# Patient Record
Sex: Female | Born: 1940 | Race: Black or African American | Hispanic: No | Smoking: Never smoker
Health system: Southern US, Community
[De-identification: ages and names within clinical notes are randomized; demographics above are authoritative.]

## PROBLEM LIST (undated history)

## (undated) DIAGNOSIS — M1990 Unspecified osteoarthritis, unspecified site: Secondary | ICD-10-CM

## (undated) DIAGNOSIS — E119 Type 2 diabetes mellitus without complications: Secondary | ICD-10-CM

## (undated) DIAGNOSIS — J309 Allergic rhinitis, unspecified: Secondary | ICD-10-CM

## (undated) DIAGNOSIS — K219 Gastro-esophageal reflux disease without esophagitis: Secondary | ICD-10-CM

## (undated) DIAGNOSIS — N3281 Overactive bladder: Secondary | ICD-10-CM

## (undated) DIAGNOSIS — I10 Essential (primary) hypertension: Secondary | ICD-10-CM

## (undated) DIAGNOSIS — G4733 Obstructive sleep apnea (adult) (pediatric): Secondary | ICD-10-CM

## (undated) DIAGNOSIS — R413 Other amnesia: Secondary | ICD-10-CM

## (undated) HISTORY — DX: Unspecified osteoarthritis, unspecified site: M19.90

## (undated) HISTORY — DX: Gastro-esophageal reflux disease without esophagitis: K21.9

## (undated) HISTORY — DX: Overactive bladder: N32.81

## (undated) HISTORY — DX: Other amnesia: R41.3

## (undated) HISTORY — DX: Allergic rhinitis, unspecified: J30.9

## (undated) HISTORY — PX: EYE SURGERY: SHX253

## (undated) HISTORY — DX: Obstructive sleep apnea (adult) (pediatric): G47.33

## (undated) HISTORY — PX: APPENDECTOMY: SHX54

---

## 2018-07-17 ENCOUNTER — Emergency Department (HOSPITAL_COMMUNITY): Payer: Medicare Other

## 2018-07-17 ENCOUNTER — Other Ambulatory Visit: Payer: Self-pay

## 2018-07-17 ENCOUNTER — Inpatient Hospital Stay (HOSPITAL_COMMUNITY)
Admission: EM | Admit: 2018-07-17 | Discharge: 2018-07-29 | DRG: 417 | Disposition: A | Discharge status: Home Health | Payer: Medicare Other | Attending: Internal Medicine | Admitting: Internal Medicine

## 2018-07-17 ENCOUNTER — Inpatient Hospital Stay (HOSPITAL_COMMUNITY): Payer: Medicare Other

## 2018-07-17 ENCOUNTER — Encounter (HOSPITAL_COMMUNITY): Payer: Self-pay | Admitting: Emergency Medicine

## 2018-07-17 DIAGNOSIS — Z833 Family history of diabetes mellitus: Secondary | ICD-10-CM

## 2018-07-17 DIAGNOSIS — E669 Obesity, unspecified: Secondary | ICD-10-CM | POA: Diagnosis not present

## 2018-07-17 DIAGNOSIS — K7460 Unspecified cirrhosis of liver: Secondary | ICD-10-CM

## 2018-07-17 DIAGNOSIS — G4700 Insomnia, unspecified: Secondary | ICD-10-CM

## 2018-07-17 DIAGNOSIS — Z880 Allergy status to penicillin: Secondary | ICD-10-CM

## 2018-07-17 DIAGNOSIS — N179 Acute kidney failure, unspecified: Secondary | ICD-10-CM | POA: Diagnosis present

## 2018-07-17 DIAGNOSIS — K668 Other specified disorders of peritoneum: Secondary | ICD-10-CM | POA: Diagnosis not present

## 2018-07-17 DIAGNOSIS — K82A1 Gangrene of gallbladder in cholecystitis: Secondary | ICD-10-CM | POA: Diagnosis present

## 2018-07-17 DIAGNOSIS — K851 Biliary acute pancreatitis without necrosis or infection: Secondary | ICD-10-CM | POA: Diagnosis present

## 2018-07-17 DIAGNOSIS — G4733 Obstructive sleep apnea (adult) (pediatric): Secondary | ICD-10-CM | POA: Diagnosis present

## 2018-07-17 DIAGNOSIS — E871 Hypo-osmolality and hyponatremia: Secondary | ICD-10-CM | POA: Diagnosis present

## 2018-07-17 DIAGNOSIS — D72829 Elevated white blood cell count, unspecified: Secondary | ICD-10-CM

## 2018-07-17 DIAGNOSIS — I10 Essential (primary) hypertension: Secondary | ICD-10-CM | POA: Diagnosis present

## 2018-07-17 DIAGNOSIS — R51 Headache: Secondary | ICD-10-CM | POA: Diagnosis not present

## 2018-07-17 DIAGNOSIS — K760 Fatty (change of) liver, not elsewhere classified: Secondary | ICD-10-CM | POA: Diagnosis present

## 2018-07-17 DIAGNOSIS — E785 Hyperlipidemia, unspecified: Secondary | ICD-10-CM | POA: Diagnosis present

## 2018-07-17 DIAGNOSIS — Z6832 Body mass index (BMI) 32.0-32.9, adult: Secondary | ICD-10-CM

## 2018-07-17 DIAGNOSIS — K819 Cholecystitis, unspecified: Secondary | ICD-10-CM | POA: Diagnosis not present

## 2018-07-17 DIAGNOSIS — Z7722 Contact with and (suspected) exposure to environmental tobacco smoke (acute) (chronic): Secondary | ICD-10-CM | POA: Diagnosis present

## 2018-07-17 DIAGNOSIS — K8000 Calculus of gallbladder with acute cholecystitis without obstruction: Secondary | ICD-10-CM | POA: Diagnosis present

## 2018-07-17 DIAGNOSIS — T8189XA Other complications of procedures, not elsewhere classified, initial encounter: Secondary | ICD-10-CM

## 2018-07-17 DIAGNOSIS — K829 Disease of gallbladder, unspecified: Secondary | ICD-10-CM

## 2018-07-17 DIAGNOSIS — F329 Major depressive disorder, single episode, unspecified: Secondary | ICD-10-CM | POA: Diagnosis present

## 2018-07-17 DIAGNOSIS — J440 Chronic obstructive pulmonary disease with acute lower respiratory infection: Secondary | ICD-10-CM | POA: Diagnosis not present

## 2018-07-17 DIAGNOSIS — E878 Other disorders of electrolyte and fluid balance, not elsewhere classified: Secondary | ICD-10-CM | POA: Diagnosis present

## 2018-07-17 DIAGNOSIS — E860 Dehydration: Secondary | ICD-10-CM | POA: Diagnosis present

## 2018-07-17 DIAGNOSIS — X088XXA Exposure to other specified smoke, fire and flames, initial encounter: Secondary | ICD-10-CM | POA: Diagnosis present

## 2018-07-17 DIAGNOSIS — K8043 Calculus of bile duct with acute cholecystitis with obstruction: Secondary | ICD-10-CM | POA: Diagnosis present

## 2018-07-17 DIAGNOSIS — Z419 Encounter for procedure for purposes other than remedying health state, unspecified: Secondary | ICD-10-CM

## 2018-07-17 DIAGNOSIS — R062 Wheezing: Secondary | ICD-10-CM

## 2018-07-17 DIAGNOSIS — T8189XS Other complications of procedures, not elsewhere classified, sequela: Secondary | ICD-10-CM | POA: Diagnosis not present

## 2018-07-17 DIAGNOSIS — Z8249 Family history of ischemic heart disease and other diseases of the circulatory system: Secondary | ICD-10-CM

## 2018-07-17 DIAGNOSIS — E119 Type 2 diabetes mellitus without complications: Secondary | ICD-10-CM | POA: Diagnosis not present

## 2018-07-17 DIAGNOSIS — K922 Gastrointestinal hemorrhage, unspecified: Secondary | ICD-10-CM | POA: Diagnosis present

## 2018-07-17 DIAGNOSIS — F0390 Unspecified dementia without behavioral disturbance: Secondary | ICD-10-CM | POA: Diagnosis present

## 2018-07-17 DIAGNOSIS — R9431 Abnormal electrocardiogram [ECG] [EKG]: Secondary | ICD-10-CM | POA: Diagnosis present

## 2018-07-17 DIAGNOSIS — R945 Abnormal results of liver function studies: Secondary | ICD-10-CM | POA: Diagnosis not present

## 2018-07-17 DIAGNOSIS — I3139 Other pericardial effusion (noninflammatory): Secondary | ICD-10-CM | POA: Diagnosis present

## 2018-07-17 DIAGNOSIS — R7989 Other specified abnormal findings of blood chemistry: Secondary | ICD-10-CM

## 2018-07-17 DIAGNOSIS — Z803 Family history of malignant neoplasm of breast: Secondary | ICD-10-CM

## 2018-07-17 DIAGNOSIS — D62 Acute posthemorrhagic anemia: Secondary | ICD-10-CM | POA: Diagnosis not present

## 2018-07-17 DIAGNOSIS — F419 Anxiety disorder, unspecified: Secondary | ICD-10-CM | POA: Diagnosis not present

## 2018-07-17 DIAGNOSIS — J209 Acute bronchitis, unspecified: Secondary | ICD-10-CM | POA: Diagnosis not present

## 2018-07-17 DIAGNOSIS — Z888 Allergy status to other drugs, medicaments and biological substances status: Secondary | ICD-10-CM

## 2018-07-17 DIAGNOSIS — E876 Hypokalemia: Secondary | ICD-10-CM | POA: Diagnosis not present

## 2018-07-17 DIAGNOSIS — Z9989 Dependence on other enabling machines and devices: Secondary | ICD-10-CM | POA: Diagnosis not present

## 2018-07-17 DIAGNOSIS — I313 Pericardial effusion (noninflammatory): Secondary | ICD-10-CM | POA: Diagnosis present

## 2018-07-17 DIAGNOSIS — Z79899 Other long term (current) drug therapy: Secondary | ICD-10-CM

## 2018-07-17 DIAGNOSIS — F03A0 Unspecified dementia, mild, without behavioral disturbance, psychotic disturbance, mood disturbance, and anxiety: Secondary | ICD-10-CM

## 2018-07-17 DIAGNOSIS — K8063 Calculus of gallbladder and bile duct with acute cholecystitis with obstruction: Secondary | ICD-10-CM | POA: Diagnosis present

## 2018-07-17 DIAGNOSIS — J9811 Atelectasis: Secondary | ICD-10-CM | POA: Diagnosis not present

## 2018-07-17 DIAGNOSIS — K219 Gastro-esophageal reflux disease without esophagitis: Secondary | ICD-10-CM | POA: Diagnosis present

## 2018-07-17 DIAGNOSIS — R188 Other ascites: Secondary | ICD-10-CM

## 2018-07-17 DIAGNOSIS — R0602 Shortness of breath: Secondary | ICD-10-CM | POA: Diagnosis not present

## 2018-07-17 DIAGNOSIS — K08109 Complete loss of teeth, unspecified cause, unspecified class: Secondary | ICD-10-CM | POA: Diagnosis present

## 2018-07-17 DIAGNOSIS — R935 Abnormal findings on diagnostic imaging of other abdominal regions, including retroperitoneum: Secondary | ICD-10-CM | POA: Diagnosis not present

## 2018-07-17 DIAGNOSIS — K769 Liver disease, unspecified: Secondary | ICD-10-CM | POA: Diagnosis not present

## 2018-07-17 HISTORY — DX: Type 2 diabetes mellitus without complications: E11.9

## 2018-07-17 HISTORY — DX: Essential (primary) hypertension: I10

## 2018-07-17 LAB — CBC
HCT: 46.7 % — ABNORMAL HIGH (ref 36.0–46.0)
Hemoglobin: 14.9 g/dL (ref 12.0–15.0)
MCH: 26.9 pg (ref 26.0–34.0)
MCHC: 31.9 g/dL (ref 30.0–36.0)
MCV: 84.4 fL (ref 80.0–100.0)
Platelets: 420 10*3/uL — ABNORMAL HIGH (ref 150–400)
RBC: 5.53 MIL/uL — ABNORMAL HIGH (ref 3.87–5.11)
RDW: 14.6 % (ref 11.5–15.5)
WBC: 19.5 10*3/uL — ABNORMAL HIGH (ref 4.0–10.5)
nRBC: 0 % (ref 0.0–0.2)

## 2018-07-17 LAB — BASIC METABOLIC PANEL
Anion gap: 14 (ref 5–15)
BUN: 18 mg/dL (ref 8–23)
CO2: 20 mmol/L — ABNORMAL LOW (ref 22–32)
Calcium: 8.6 mg/dL — ABNORMAL LOW (ref 8.9–10.3)
Chloride: 87 mmol/L — ABNORMAL LOW (ref 98–111)
Creatinine, Ser: 1.19 mg/dL — ABNORMAL HIGH (ref 0.44–1.00)
GFR calc Af Amer: 52 mL/min — ABNORMAL LOW (ref >60)
GFR calc non Af Amer: 45 mL/min — ABNORMAL LOW (ref >60)
Glucose, Bld: 121 mg/dL — ABNORMAL HIGH (ref 70–99)
Potassium: 4.9 mmol/L (ref 3.5–5.1)
Sodium: 121 mmol/L — ABNORMAL LOW (ref 135–145)

## 2018-07-17 LAB — AMMONIA: Ammonia: 37 umol/L — ABNORMAL HIGH (ref 9–35)

## 2018-07-17 LAB — COMPREHENSIVE METABOLIC PANEL
ALT: 308 U/L — ABNORMAL HIGH (ref 0–44)
AST: 350 U/L — ABNORMAL HIGH (ref 15–41)
Albumin: 3.2 g/dL — ABNORMAL LOW (ref 3.5–5.0)
Alkaline Phosphatase: 372 U/L — ABNORMAL HIGH (ref 38–126)
Anion gap: 16 — ABNORMAL HIGH (ref 5–15)
BUN: 12 mg/dL (ref 8–23)
CO2: 18 mmol/L — ABNORMAL LOW (ref 22–32)
Calcium: 9 mg/dL (ref 8.9–10.3)
Chloride: 91 mmol/L — ABNORMAL LOW (ref 98–111)
Creatinine, Ser: 1.06 mg/dL — ABNORMAL HIGH (ref 0.44–1.00)
GFR calc Af Amer: 59 mL/min — ABNORMAL LOW (ref >60)
GFR calc non Af Amer: 51 mL/min — ABNORMAL LOW (ref >60)
Glucose, Bld: 182 mg/dL — ABNORMAL HIGH (ref 70–99)
Potassium: 3.8 mmol/L (ref 3.5–5.1)
Sodium: 125 mmol/L — ABNORMAL LOW (ref 135–145)
Total Bilirubin: 8.1 mg/dL — ABNORMAL HIGH (ref 0.3–1.2)
Total Protein: 6.6 g/dL (ref 6.5–8.1)

## 2018-07-17 LAB — URINALYSIS, ROUTINE W REFLEX MICROSCOPIC
Bacteria, UA: NONE SEEN
Bilirubin Urine: NEGATIVE
Glucose, UA: NEGATIVE mg/dL
Hgb urine dipstick: NEGATIVE
Ketones, ur: NEGATIVE mg/dL
Nitrite: NEGATIVE
Protein, ur: NEGATIVE mg/dL
Specific Gravity, Urine: 1 — ABNORMAL LOW (ref 1.005–1.030)
pH: 6 (ref 5.0–8.0)

## 2018-07-17 LAB — TROPONIN I: Troponin I: 0.03 ng/mL (ref <0.03)

## 2018-07-17 LAB — PROTIME-INR
INR: 1.25
Prothrombin Time: 15.6 seconds — ABNORMAL HIGH (ref 11.4–15.2)

## 2018-07-17 LAB — ACETAMINOPHEN LEVEL: Acetaminophen (Tylenol), Serum: <10 ug/mL — ABNORMAL LOW (ref 10–30)

## 2018-07-17 LAB — PHOSPHORUS: Phosphorus: 3.8 mg/dL (ref 2.5–4.6)

## 2018-07-17 LAB — MAGNESIUM: Magnesium: 1.7 mg/dL (ref 1.7–2.4)

## 2018-07-17 LAB — PROCALCITONIN: Procalcitonin: 13.99 ng/mL

## 2018-07-17 LAB — LIPASE, BLOOD: Lipase: 144 U/L — ABNORMAL HIGH (ref 11–51)

## 2018-07-17 LAB — ETHANOL: Alcohol, Ethyl (B): <10 mg/dL (ref <10)

## 2018-07-17 MED ORDER — PIPERACILLIN-TAZOBACTAM 3.375 G IVPB 30 MIN
3.3750 g | Freq: Once | INTRAVENOUS | Status: AC
  Administered 2018-07-17: 3.375 g via INTRAVENOUS
  Filled 2018-07-17: qty 50

## 2018-07-17 MED ORDER — SODIUM CHLORIDE (PF) 0.9 % IJ SOLN
INTRAMUSCULAR | Status: AC
  Filled 2018-07-17: qty 50

## 2018-07-17 MED ORDER — HYDROMORPHONE HCL 1 MG/ML IJ SOLN
0.5000 mg | Freq: Once | INTRAMUSCULAR | Status: AC
  Administered 2018-07-17: 0.5 mg via INTRAVENOUS
  Filled 2018-07-17: qty 1

## 2018-07-17 MED ORDER — CIPROFLOXACIN IN D5W 400 MG/200ML IV SOLN
400.0000 mg | Freq: Once | INTRAVENOUS | Status: AC
  Administered 2018-07-17: 400 mg via INTRAVENOUS
  Filled 2018-07-17: qty 200

## 2018-07-17 MED ORDER — ONDANSETRON HCL 4 MG/2ML IJ SOLN
4.0000 mg | Freq: Once | INTRAMUSCULAR | Status: AC
  Administered 2018-07-17: 4 mg via INTRAVENOUS
  Filled 2018-07-17: qty 2

## 2018-07-17 MED ORDER — IOPAMIDOL (ISOVUE-300) INJECTION 61%
INTRAVENOUS | Status: AC
  Filled 2018-07-17: qty 100

## 2018-07-17 MED ORDER — IOPAMIDOL (ISOVUE-300) INJECTION 61%
100.0000 mL | Freq: Once | INTRAVENOUS | Status: AC | PRN
  Administered 2018-07-17: 100 mL via INTRAVENOUS

## 2018-07-17 MED ORDER — SODIUM CHLORIDE 0.9% FLUSH
3.0000 mL | Freq: Once | INTRAVENOUS | Status: AC
  Administered 2018-07-17: 3 mL via INTRAVENOUS

## 2018-07-17 MED ORDER — PANTOPRAZOLE SODIUM 40 MG IV SOLR
40.0000 mg | Freq: Two times a day (BID) | INTRAVENOUS | Status: DC
  Administered 2018-07-17 – 2018-07-22 (×10): 40 mg via INTRAVENOUS
  Filled 2018-07-17 (×12): qty 40

## 2018-07-17 MED ORDER — PIPERACILLIN-TAZOBACTAM 3.375 G IVPB
3.3750 g | Freq: Three times a day (TID) | INTRAVENOUS | Status: DC
  Administered 2018-07-18 – 2018-07-24 (×20): 3.375 g via INTRAVENOUS
  Filled 2018-07-17 (×23): qty 50

## 2018-07-17 NOTE — ED Notes (Signed)
Pt's daughter can be contacted at [phone].

## 2018-07-17 NOTE — H&P (Signed)
Kristen Martinez OXB:[id number] DOB: 23-Jun-1940 DOA: 07/17/2018     PCP: Patient, No Pcp Per   Outpatient Specialists:   NONE    Patient arrived to ER on 07/17/18 at 1217  Patient coming from: home Lives With family    Chief Complaint:  Chief Complaint  Patient presents with  . Shortness of Breath    HPI: Kristen Martinez is a 78 y.o. female with medical history significant of  borderline DM, early dementia, PSA on CPAP    Presented with about 2 days hx of  right upper quadrant and epigastric abdominal pain. Associated with nausea and vomiting she has been belching a lot.  Pain radiating to the back Worse with eating  No diarrhea, denies constipation Her urine has been cloudy and foul smelling  Family had recent GI bug.   She is originally from [place], moved Kristen Martinez to be with her family after diagnosis of early dementia her husband has passed away 6 years ago  NO hx of liver disease.  No history of alcohol abuse no excessive use of acetaminophen no prior gallstone disease  She had some subjective fevers Possibly a bit of wt loss  She have had some abdominal distention Still passing stool and gas  Have had cough for the past week productive of sputum  Had exposure to fire fumes last week and had some headache and nausea since.  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 DG Chest 2 View  . US Abdomen Complete  . Lipase, blood  . Comprehensive metabolic panel  . CBC  . Urinalysis, Routine w reflex microscopic  . Diet NPO time specified  . If O2 Sat <94% administer O2 at 2 liters/minute via nasal cannula  . Saline Lock IV, Maintain IV access  . Consult to general surgery  . Consult to hospitalist  . Pulse oximetry,  continuous  . EKG 12-Lead  . ED EKG    Following Medications were ordered in ER: Medications  ciprofloxacin (CIPRO) IVPB 400 mg (400 mg Intravenous New Bag/Given 07/17/18 1847)  sodium chloride flush (NS) 0.9 % injection 3 mL (3 mLs Intravenous Given 07/17/18 1813)  HYDROmorphone (DILAUDID) injection 0.5 mg (0.5 mg Intravenous Given 07/17/18 1813)  ondansetron (ZOFRAN) injection 4 mg (4 mg Intravenous Given 07/17/18 1813)    Significant initial  Findings: Abnormal Labs Reviewed  LIPASE, BLOOD - Abnormal; Notable for the following components:      Result Value   Lipase 144 (*)    All other components within normal limits  COMPREHENSIVE METABOLIC PANEL - Abnormal; Notable for the following components:   Sodium 125 (*)    Chloride 91 (*)    CO2 18 (*)    Glucose, Bld 182 (*)    Creatinine, Ser 1.06 (*)    Albumin 3.2 (*)    AST 350 (*)    ALT 308 (*)    Alkaline Phosphatase 372 (*)    Total Bilirubin 8.1 (*)    GFR calc non Af Amer 51 (*)    GFR calc Af Amer 59 (*)    Anion gap 16 (*)    All other components within normal limits  CBC - Abnormal; Notable for the following components:   WBC 19.5 (*)  RBC 5.53 (*)    HCT 46.7 (*)    Platelets 420 (*)    All other components within normal limits  URINALYSIS, ROUTINE W REFLEX MICROSCOPIC - Abnormal; Notable for the following components:   Color, Urine STRAW (*)    Specific Gravity, Urine 1.000 (*)    Leukocytes, UA TRACE (*)    All other components within normal limits   Lipase 144  Lactic Acid, Venous No results found for: LATICACIDVEN  Na 126 K 3.8 Alb 3.2   Lab Results  Component Value Date   CREATININE 1.06 (H) 07/17/2018      WBC 19.5  HG/HCT   Stable,       Component Value Date/Time   HGB 14.9 07/17/2018 1247   HCT 46.7 (H) 07/17/2018 1247    Troponin (Point of Care Test) No results for input(s): TROPIPOC in the last 72 hours.    BNP (last 3 results) No results for input(s): BNP in the last 8760  hours.  ProBNP (last 3 results) No results for input(s): PROBNP in the last 8760 hours.     UA   no evidence of UTI       CXR -  NON acute  CTabd/pelvis -showing GE junction narrowing and large amount of fluid around small curvature of stomach but no evidence of perforation no evidence of pancreatitis  ECG:  Personally reviewed by me showing: HR : 107  Rhythm:   Sinus tachycardia    no evidence of ischemic changes QTC 529     ED Triage Vitals  Enc Vitals Group     BP 07/17/18 1230 137/80     Pulse Rate 07/17/18 1230 (!) 119     Resp 07/17/18 1230 20     Temp 07/17/18 1230 98.4 F (36.9 C)     Temp Source 07/17/18 1230 Oral     SpO2 07/17/18 1230 94 %     Weight 07/17/18 1225 180 lb (81.6 kg)     Height 07/17/18 1225 5\' 2"  (1.575 m)     Head Circumference --      Peak Flow --      Pain Score 07/17/18 1225 6     Pain Loc --      Pain Edu? --      Excl. in GC? --   TMAX(24)@       Latest  Blood pressure 135/76, pulse 100, temperature 98.4 F (36.9 C), temperature source Oral, resp. rate 18, height 5\' 2"  (1.575 m), weight 81.6 kg, SpO2 94 %.    ER Provider Called:  Gi    Dr. Christella HartiganJacobs They Recommend admit to medicine ERCP in AM  Will see in AM   Hospitalist was called for admission for working diagnosis of Cholecystitis with common bile duct disease    Review of Systems:    Pertinent positives include:  Fevers, chills  Constitutional:  No weight loss, night sweats,, fatigue, weight loss  HEENT:  No headaches, Difficulty swallowing,Tooth/dental problems,Sore throat,  No sneezing, itching, ear ache, nasal congestion, post nasal drip,  Cardio-vascular:  No chest pain, Orthopnea, PND, anasarca, dizziness, palpitations.no Bilateral lower extremity swelling  GI:  No heartburn, indigestion, abdominal pain, nausea, vomiting, diarrhea, change in bowel habits, loss of appetite, melena, blood in stool, hematemesis Resp:  no shortness of breath at rest. No dyspnea on  exertion, No excess mucus, no productive cough, No non-productive cough, No coughing up of blood.No change in color of mucus.No wheezing. Skin:  no rash or  lesions. No jaundice GU:  no dysuria, change in color of urine, no urgency or frequency. No straining to urinate.  No flank pain.  Musculoskeletal:  No joint pain or no joint swelling. No decreased range of motion. No back pain.  Psych:  No change in mood or affect. No depression or anxiety. No memory loss.  Neuro: no localizing neurological complaints, no tingling, no weakness, no double vision, no gait abnormality, no slurred speech, no confusion  All systems reviewed and apart from HOPI all are negative  Past Medical History:   Past Medical History:  Diagnosis Date  . Hypertension       History reviewed. No pertinent surgical history.  Social History:  Ambulatory  Independently     reports that she has never smoked. She has never used smokeless tobacco. She reports that she does not drink alcohol or use drugs.     Family History:   Family History  Problem Relation Age of Onset  . Diabetes Mother   . Hypertension Father   . Breast cancer Other   . Diabetes Other   . Stroke Neg Hx   . CAD Neg Hx   . Liver disease Neg Hx     Allergies: Allergies  Allergen Reactions  . Prednisone     Tongue swelling     Prior to Admission medications   Medication Sig Start Date End Date Taking? Authorizing Provider  calcium carbonate (TUMS - DOSED IN MG ELEMENTAL CALCIUM) 500 MG chewable tablet Chew 1 tablet by mouth at bedtime.   Yes [provider]  donepezil (ARICEPT) 10 MG tablet Take 10 mg by mouth at bedtime.   Yes [provider]  DULoxetine (CYMBALTA) 20 MG capsule Take 20 mg by mouth at bedtime.   Yes [provider]  mirtazapine (REMERON) 15 MG tablet Take 15 mg by mouth at bedtime.   Yes [provider]  omeprazole (PRILOSEC) 20 MG capsule Take 20 mg by mouth at bedtime.   Yes  [provider]  oxybutynin (DITROPAN) 5 MG tablet Take 5 mg by mouth at bedtime.   Yes [provider]   Physical Exam: Blood pressure 135/76, pulse 100, temperature 98.4 F (36.9 C), temperature source Oral, resp. rate 18, height 5\' 2"  (1.575 m), weight 81.6 kg, SpO2 94 %. 1. General:  in No  Acute distress   well -appearing 2. Psychological: Alert and   Oriented to self 3. Head/ENT:     Dry Mucous Membranes                          Head Non traumatic, neck supple                         Poor Dentition 4. SKIN: decreased Skin turgor,  Skin clean Dry and intact no rash 5. Heart: Regular rate and rhythm no Murmur, no Rub or gallop 6. Lungs: Clear to auscultation bilaterally, no wheezes or crackles   7. Abdomen: Soft, RUQ tenderness,  distended    Diminished bowel sounds present 8. Lower extremities: no clubbing, cyanosis, no   edema 9. Neurologically Grossly intact, moving all 4 extremities equally  10. MSK: Normal range of motion   LABS:     Recent Labs  Lab 07/17/18 1247  WBC 19.5*  HGB 14.9  HCT 46.7*  MCV 84.4  PLT 420*   Basic Metabolic Panel: Recent Labs  Lab 07/17/18 1247  NA 125*  K 3.8  CL 91*  CO2 18*  GLUCOSE 182*  BUN 12  CREATININE 1.06*  CALCIUM 9.0      Recent Labs  Lab 07/17/18 1247  AST 350*  ALT 308*  ALKPHOS 372*  BILITOT 8.1*  PROT 6.6  ALBUMIN 3.2*   Recent Labs  Lab 07/17/18 1247  LIPASE 144*   No results for input(s): AMMONIA in the last 168 hours.    HbA1C: No results for input(s): HGBA1C in the last 72 hours. CBG: No results for input(s): GLUCAP in the last 168 hours.    Urine analysis:    Component Value Date/Time   COLORURINE STRAW (A) 07/17/2018 1552   APPEARANCEUR CLEAR 07/17/2018 1552   LABSPEC 1.000 (L) 07/17/2018 1552   PHURINE 6.0 07/17/2018 1552   GLUCOSEU NEGATIVE 07/17/2018 1552   HGBUR NEGATIVE 07/17/2018 1552   BILIRUBINUR NEGATIVE 07/17/2018 1552   KETONESUR NEGATIVE 07/17/2018  1552   PROTEINUR NEGATIVE 07/17/2018 1552   NITRITE NEGATIVE 07/17/2018 1552   LEUKOCYTESUR TRACE (A) 07/17/2018 1552      Cultures: No results found for: SDES, SPECREQUEST, CULT, REPTSTATUS   Radiological Exams on Admission: Dg Chest 2 View  Result Date: 07/17/2018 CLINICAL DATA:  Initial evaluation for acute shortness of breath. EXAM: CHEST - 2 VIEW COMPARISON:  None. FINDINGS: Cardiac and mediastinal silhouettes within normal limits. Lungs hypoinflated. Streaky bibasilar opacities, most consistent with atelectasis. No consolidative opacity. No edema or pleural effusion. No pneumothorax. No acute osseous finding. Degenerative changes noted about the shoulders bilaterally. IMPRESSION: 1. Shallow lung inflation with streaky bibasilar atelectatic changes. 2. No other active cardiopulmonary disease. Electronically Signed   By: Rise Mu M.D.   On: 07/17/2018 13:25   US Abdomen Complete  Result Date: 07/17/2018 CLINICAL DATA:  Abdominal pain EXAM: ABDOMEN ULTRASOUND COMPLETE COMPARISON:  None. FINDINGS: Gallbladder: Gallbladder sludge with large calculus measuring 5 cm. No pericholecystic fluid. There is wall thickening measuring up to 10 mm fall on the hepatic margin. A negative sonographic Eulah Pont sign was reported by the sonographer. Common bile duct: Diameter: 4 mm Liver: Coarse hepatic echotexture. Small volume perihepatic ascites adjacent to the left hepatic lobe. There is hepatopetal blood flow within the portal vein. Small caliber adjacent vessels may indicate cavernous transformation. IVC: No abnormality visualized. Pancreas: Poorly visualized due to body habitus. Spleen: Size and appearance within normal limits. Right Kidney: Length: 11.9 cm. Echogenicity within normal limits. No mass or hydronephrosis visualized. Left Kidney: Length: 12.3 cm. Echogenicity within normal limits. No mass or hydronephrosis visualized. There is a parapelvic cyst measuring 2.6 x 2.3 x 2.0 cm Abdominal  aorta: Poorly visualized due to bowel gas. No visible aneurysm. Other findings: None. IMPRESSION: 1. Coarse hepatic echotexture with small amount of perihepatic ascites. This may indicate chronic liver disease, including hepatic cirrhosis. 2. Hepatopetal blood flow demonstrated within the portal vein. Small caliber nearby vessels may be a sign of cavernous transformation. Correlation with abdominal MRI with and without contrast might be helpful. 3. Gallbladder sludge with large calculus and wall thickening. Negative sonographic Murphy sign. Electronically Signed   By: Deatra Robinson M.D.   On: 07/17/2018 17:48    Chart has been reviewed    Assessment/Plan   78 y.o. female with medical history significant of  borderline DM, early dementia, PSA on CPAP  Admitted for possible cholecystitis with severely elevated LFTs gallstone disease and elevated white blood cell count with GE junction stenosis possibly an intra-abdominal fluid of unclear etiology  Present  on Admission: . Cholecystitis -need cholecystectomy but at this point will need further evaluation for underlying inflammation.  Continue with n.p.o. status bowel rest IV fluids and IV and broad-spectrum antibiotics.  Cholecystitis may be resulting in intra-abdominal inflammation diffuse with fluid surrounding stomach and GE junction with no free air to suggest rupture.  Discussed case with Dr. Michaell Cowing with general surgery.  No acute indication for operative intervention at this time but will continue to follow with [place] appreciate their input.  Recommend GI consult to further evaluate GE junction and or needing ERCP/MRCP This point no evidence of pancreatitis on CT scan or no significant lipase elevation but will keep it in mind repeat lipase in a.m.  . Liver disease -denies history of alcohol abuse will check lipids and hepatitis panel, may need further investigation such as MRCP.  Appreciate GI input elevated LFTs could be in the setting of gall bladder  disease versus underlying liver disease we will need records obtained from [place]  . Hyponatremia -gently rehydrate again could be in the setting of dehydration secondary to recent nausea vomiting versus underlying liver disease.  Will obtain urine electrolytes . Prolonged QT interval -monitor on telemetry avoid QT prolonging medications . Pericardial effusion -mild will order echogram OSA - CPAP once cleared by GI but there is no evidence of obstruction that could cause abdominal distention with pressure Other plan as per orders. Diabetes mellitus -currently no longer taking any medications for it.  Will order sliding scale and check hemoglobin A1c DVT prophylaxis:  SCD     Code Status:  FULL CODE   as per patient  I had personally discussed CODE STATUS with patient and family   Family Communication:   Family  at  Bedside  plan of care was discussed with  , Daughter,   Disposition Plan:    To home once workup is complete and patient is stable                    Consults called: Gi consult   ,  surgery consult  Admission status:   inpatient     Expect 2 midnight stay secondary to severity of patient's current illness including      Severe lab/radiological/exam abnormalities including:  choolecystitis   and extensive comorbidities including:  DM2   dementia  That are currently affecting medical management.   I expect  patient to be hospitalized for 2 midnights requiring inpatient medical care.  Patient is at high risk for adverse outcome (such as loss of life or disability) if not treated.  Indication for inpatient stay as follows:      severe pain requiring acute inpatient management,  inability to maintain oral hydration    Need for operative/procedural  intervention    Need for IV antibiotics, IV fluids,      Level of care    tele  For   24H        Nadeem Romanoski 07/17/2018, 8:52 PM    [facility]     after 2 AM please page floor coverage PA If  7AM-7PM, please contact the day team taking care of the patient using [email or web address]

## 2018-07-17 NOTE — ED Triage Notes (Addendum)
Reports shortness of breath for 2 days.  Reports vomiting as well.  Denies chest pain.  Endorses mid abdominal pain with tenderness on palpation.

## 2018-07-17 NOTE — ED Provider Notes (Signed)
[facility] DEPT Provider Note   CSN: [id number] Arrival date & time: 07/17/18  1217     History   Chief Complaint Chief Complaint  Patient presents with  . Shortness of Breath    HPI Kristen Martinez is a 78 y.o. female.  78 year old female presents with acute onset of right upper quadrant and epigastric abdominal pain times several days.  Patient states that her speech takes a deep breath.  She has had nonbilious emesis with some subjective fevers.  Denies any black or bloody stools.  Pain is sharp does meet her back.  It is somewhat worse after she eats.  Denies any history of cholelithiasis.  Has not use any treatment prior to arrival.     Past Medical History:  Diagnosis Date  . Hypertension     There are no active problems to display for this patient.   History reviewed. No pertinent surgical history.   OB History   No obstetric history on file.      Home Medications    Prior to Admission medications   Not on File    Family History History reviewed. No pertinent family history.  Social History Social History   Tobacco Use  . Smoking status: Never Smoker  . Smokeless tobacco: Never Used  Substance Use Topics  . Alcohol use: Never    Frequency: Never  . Drug use: Never     Allergies   Penicillins   Review of Systems Review of Systems  All other systems reviewed and are negative.    Physical Exam Updated Vital Signs BP 132/81   Pulse (!) 104   Temp 98.4 F (36.9 C) (Oral)   Resp (!) 25   Ht 1.575 m ('5\' 2"'$ )   Wt 81.6 kg   SpO2 96%   BMI 32.92 kg/m   Physical Exam Vitals signs and nursing note reviewed.  Constitutional:      General: She is not in acute distress.    Appearance: Normal appearance. She is well-developed. She is not toxic-appearing.  HENT:     Head: Normocephalic and atraumatic.  Eyes:     General: Lids are normal. Scleral icterus present.     Conjunctiva/sclera: Conjunctivae  normal.     Pupils: Pupils are equal, round, and reactive to light.  Neck:     Musculoskeletal: Normal range of motion and neck supple.     Thyroid: No thyroid mass.     Trachea: No tracheal deviation.  Cardiovascular:     Rate and Rhythm: Normal rate and regular rhythm.     Heart sounds: Normal heart sounds. No murmur. No gallop.   Pulmonary:     Effort: Pulmonary effort is normal. No respiratory distress.     Breath sounds: Normal breath sounds. No stridor. No decreased breath sounds, wheezing, rhonchi or rales.  Abdominal:     General: Bowel sounds are normal. There is no distension.     Palpations: Abdomen is soft.     Tenderness: There is abdominal tenderness in the right upper quadrant and epigastric area. There is guarding. There is no rebound.    Musculoskeletal: Normal range of motion.        General: No tenderness.  Skin:    General: Skin is warm and dry.     Findings: No abrasion or rash.  Neurological:     Mental Status: She is alert and oriented to person, place, and time.     GCS: GCS eye subscore is 4. GCS  verbal subscore is 5. GCS motor subscore is 6.     Cranial Nerves: No cranial nerve deficit.     Sensory: No sensory deficit.  Psychiatric:        Speech: Speech normal.        Behavior: Behavior normal.      ED Treatments / Results  Labs (all labs ordered are listed, but only abnormal results are displayed) Labs Reviewed  LIPASE, BLOOD - Abnormal; Notable for the following components:      Result Value   Lipase 144 (*)    All other components within normal limits  COMPREHENSIVE METABOLIC PANEL - Abnormal; Notable for the following components:   Sodium 125 (*)    Chloride 91 (*)    CO2 18 (*)    Glucose, Bld 182 (*)    Creatinine, Ser 1.06 (*)    Albumin 3.2 (*)    AST 350 (*)    ALT 308 (*)    Alkaline Phosphatase 372 (*)    Total Bilirubin 8.1 (*)    GFR calc non Af Amer 51 (*)    GFR calc Af Amer 59 (*)    Anion gap 16 (*)    All other  components within normal limits  CBC - Abnormal; Notable for the following components:   WBC 19.5 (*)    RBC 5.53 (*)    HCT 46.7 (*)    Platelets 420 (*)    All other components within normal limits  URINALYSIS, ROUTINE W REFLEX MICROSCOPIC    EKG EKG Interpretation  Date/Time:  Wednesday July 17 2018 12:28:47 EST Ventricular Rate:  118 PR Interval:    QRS Duration: 80 QT Interval:  302 QTC Calculation: 424 R Axis:   11 Text Interpretation:  Sinus tachycardia Low voltage, precordial leads Abnormal T, consider ischemia, lateral leads Baseline wander in lead(s) V4 V5 Confirmed by Lacretia Leigh ([id number]) on 07/17/2018 3:56:05 PM   Radiology Dg Chest 2 View  Result Date: 07/17/2018 CLINICAL DATA:  Initial evaluation for acute shortness of breath. EXAM: CHEST - 2 VIEW COMPARISON:  None. FINDINGS: Cardiac and mediastinal silhouettes within normal limits. Lungs hypoinflated. Streaky bibasilar opacities, most consistent with atelectasis. No consolidative opacity. No edema or pleural effusion. No pneumothorax. No acute osseous finding. Degenerative changes noted about the shoulders bilaterally. IMPRESSION: 1. Shallow lung inflation with streaky bibasilar atelectatic changes. 2. No other active cardiopulmonary disease. Electronically Signed   By: Jeannine Boga M.D.   On: 07/17/2018 13:25    Procedures Procedures (including critical care time)  Medications Ordered in ED Medications  sodium chloride flush (NS) 0.9 % injection 3 mL (has no administration in time range)  HYDROmorphone (DILAUDID) injection 0.5 mg (has no administration in time range)  ondansetron (ZOFRAN) injection 4 mg (has no administration in time range)     Initial Impression / Assessment and Plan / ED Course  I have reviewed the triage vital signs and the nursing notes.  Pertinent labs & imaging results that were available during my care of the patient were reviewed by me and considered in my medical  decision making (see chart for details).     Patient medicated for pain here and does feel better.  Patient's blood work noted and does have evidence of increased bili as well as transaminases and alk phos.  Also has mild elevated lipase is with leukocytosis.  Underwent abdominal ultrasound which does show gallbladder sludge and wall thickening.  Consult with Dr. Ardis Hughs from GI who states  he will see the patient in consultation with the hospitalist.  Recommends patient be started on IV Cipro.  Discussed with Dr. Roel Cluck who will admit the patient  Final Clinical Impressions(s) / ED Diagnoses   Final diagnoses:  None    ED Discharge Orders    None       Lacretia Leigh, MD 07/17/18 1846

## 2018-07-17 NOTE — ED Notes (Signed)
Date and time results received: 07/17/18 2241 (use smartphrase ".now" to insert current time)  Test: troponin Critical Value: 0.03  Name of Provider Notified: Kathrine HaddockAmber Wood  Orders Received? Or Actions Taken?: Actions Taken: notified Amber Wood of troponin 0.03 so she can call admitting doctor with results.

## 2018-07-18 ENCOUNTER — Inpatient Hospital Stay (HOSPITAL_COMMUNITY): Payer: Medicare Other

## 2018-07-18 ENCOUNTER — Other Ambulatory Visit: Payer: Self-pay

## 2018-07-18 DIAGNOSIS — K769 Liver disease, unspecified: Secondary | ICD-10-CM

## 2018-07-18 DIAGNOSIS — R0602 Shortness of breath: Secondary | ICD-10-CM

## 2018-07-18 DIAGNOSIS — K819 Cholecystitis, unspecified: Secondary | ICD-10-CM

## 2018-07-18 DIAGNOSIS — R945 Abnormal results of liver function studies: Secondary | ICD-10-CM

## 2018-07-18 DIAGNOSIS — K8043 Calculus of bile duct with acute cholecystitis with obstruction: Secondary | ICD-10-CM | POA: Diagnosis present

## 2018-07-18 DIAGNOSIS — R935 Abnormal findings on diagnostic imaging of other abdominal regions, including retroperitoneum: Secondary | ICD-10-CM | POA: Insufficient documentation

## 2018-07-18 LAB — LIPID PANEL
Cholesterol: 243 mg/dL — ABNORMAL HIGH (ref 0–200)
HDL: 84 mg/dL (ref >40)
LDL Cholesterol: 136 mg/dL — ABNORMAL HIGH (ref 0–99)
Total CHOL/HDL Ratio: 2.9 RATIO
Triglycerides: 113 mg/dL (ref <150)
VLDL: 23 mg/dL (ref 0–40)

## 2018-07-18 LAB — COMPREHENSIVE METABOLIC PANEL
ALT: 203 U/L — ABNORMAL HIGH (ref 0–44)
AST: 140 U/L — ABNORMAL HIGH (ref 15–41)
Albumin: 3 g/dL — ABNORMAL LOW (ref 3.5–5.0)
Alkaline Phosphatase: 308 U/L — ABNORMAL HIGH (ref 38–126)
Anion gap: 17 — ABNORMAL HIGH (ref 5–15)
BUN: 23 mg/dL (ref 8–23)
CO2: 17 mmol/L — ABNORMAL LOW (ref 22–32)
Calcium: 8.7 mg/dL — ABNORMAL LOW (ref 8.9–10.3)
Chloride: 92 mmol/L — ABNORMAL LOW (ref 98–111)
Creatinine, Ser: 1.69 mg/dL — ABNORMAL HIGH (ref 0.44–1.00)
GFR calc Af Amer: 34 mL/min — ABNORMAL LOW (ref >60)
GFR calc non Af Amer: 29 mL/min — ABNORMAL LOW (ref >60)
Glucose, Bld: 110 mg/dL — ABNORMAL HIGH (ref 70–99)
Potassium: 4.1 mmol/L (ref 3.5–5.1)
Sodium: 126 mmol/L — ABNORMAL LOW (ref 135–145)
Total Bilirubin: 7.9 mg/dL — ABNORMAL HIGH (ref 0.3–1.2)
Total Protein: 6.4 g/dL — ABNORMAL LOW (ref 6.5–8.1)

## 2018-07-18 LAB — APTT: aPTT: 34 seconds (ref 24–36)

## 2018-07-18 LAB — SODIUM, URINE, RANDOM: Sodium, Ur: <10 mmol/L

## 2018-07-18 LAB — CBG MONITORING, ED
Glucose-Capillary: 162 mg/dL — ABNORMAL HIGH (ref 70–99)
Glucose-Capillary: 111 mg/dL — ABNORMAL HIGH (ref 70–99)
Glucose-Capillary: 112 mg/dL — ABNORMAL HIGH (ref 70–99)
Glucose-Capillary: 115 mg/dL — ABNORMAL HIGH (ref 70–99)

## 2018-07-18 LAB — CBC
HCT: 42.9 % (ref 36.0–46.0)
Hemoglobin: 13.8 g/dL (ref 12.0–15.0)
MCH: 27.4 pg (ref 26.0–34.0)
MCHC: 32.2 g/dL (ref 30.0–36.0)
MCV: 85.1 fL (ref 80.0–100.0)
Platelets: 380 10*3/uL (ref 150–400)
RBC: 5.04 MIL/uL (ref 3.87–5.11)
RDW: 15.1 % (ref 11.5–15.5)
WBC: 21.1 10*3/uL — ABNORMAL HIGH (ref 4.0–10.5)
nRBC: 0 % (ref 0.0–0.2)

## 2018-07-18 LAB — RAPID URINE DRUG SCREEN, HOSP PERFORMED
Amphetamines: NOT DETECTED
Barbiturates: NOT DETECTED
Benzodiazepines: NOT DETECTED
Cocaine: NOT DETECTED
Opiates: POSITIVE — AB
Tetrahydrocannabinol: POSITIVE — AB

## 2018-07-18 LAB — TROPONIN I
Troponin I: 0.03 ng/mL (ref <0.03)
Troponin I: 0.03 ng/mL (ref <0.03)
Troponin I: <0.03 ng/mL (ref <0.03)

## 2018-07-18 LAB — ECHOCARDIOGRAM COMPLETE
Height: 62 in
Weight: 2880 oz

## 2018-07-18 LAB — LIPASE, BLOOD: Lipase: 59 U/L — ABNORMAL HIGH (ref 11–51)

## 2018-07-18 LAB — GLUCOSE, CAPILLARY
Glucose-Capillary: 121 mg/dL — ABNORMAL HIGH (ref 70–99)
Glucose-Capillary: 116 mg/dL — ABNORMAL HIGH (ref 70–99)

## 2018-07-18 LAB — HEMOGLOBIN AND HEMATOCRIT, BLOOD
HCT: 41.2 % (ref 36.0–46.0)
Hemoglobin: 13.4 g/dL (ref 12.0–15.0)

## 2018-07-18 LAB — CREATININE, URINE, RANDOM: Creatinine, Urine: 93.24 mg/dL

## 2018-07-18 LAB — HEMOGLOBIN A1C
Hgb A1c MFr Bld: 6.3 % — ABNORMAL HIGH (ref 4.8–5.6)
Mean Plasma Glucose: 134.11 mg/dL

## 2018-07-18 LAB — MAGNESIUM: Magnesium: 1.7 mg/dL (ref 1.7–2.4)

## 2018-07-18 LAB — TSH: TSH: 0.564 u[IU]/mL (ref 0.350–4.500)

## 2018-07-18 LAB — PHOSPHORUS: Phosphorus: 4.2 mg/dL (ref 2.5–4.6)

## 2018-07-18 LAB — LACTIC ACID, PLASMA: Lactic Acid, Venous: 1.5 mmol/L (ref 0.5–1.9)

## 2018-07-18 LAB — OSMOLALITY, URINE: Osmolality, Ur: 321 mOsm/kg (ref 300–900)

## 2018-07-18 LAB — OSMOLALITY: Osmolality: 257 mOsm/kg — ABNORMAL LOW (ref 275–295)

## 2018-07-18 MED ORDER — HYDROMORPHONE HCL 1 MG/ML IJ SOLN
1.0000 mg | INTRAMUSCULAR | Status: DC | PRN
  Administered 2018-07-18 – 2018-07-19 (×2): 1 mg via INTRAVENOUS
  Filled 2018-07-18 (×2): qty 1

## 2018-07-18 MED ORDER — SODIUM CHLORIDE 0.9 % IV SOLN
INTRAVENOUS | Status: AC
  Administered 2018-07-18: 01:00:00 via INTRAVENOUS

## 2018-07-18 MED ORDER — SODIUM CHLORIDE 0.9 % IV BOLUS
1000.0000 mL | Freq: Once | INTRAVENOUS | Status: AC
  Administered 2018-07-18: 1000 mL via INTRAVENOUS

## 2018-07-18 MED ORDER — LORAZEPAM 2 MG/ML IJ SOLN
0.5000 mg | Freq: Once | INTRAMUSCULAR | Status: AC
  Administered 2018-07-18: 0.5 mg via INTRAVENOUS
  Filled 2018-07-18: qty 1

## 2018-07-18 MED ORDER — OXYCODONE HCL 5 MG PO TABS
5.0000 mg | ORAL_TABLET | ORAL | Status: DC | PRN
  Administered 2018-07-21 – 2018-07-22 (×2): 5 mg via ORAL
  Filled 2018-07-18 (×2): qty 1

## 2018-07-18 MED ORDER — INSULIN ASPART 100 UNIT/ML ~~LOC~~ SOLN
0.0000 [IU] | SUBCUTANEOUS | Status: DC
  Administered 2018-07-18: 1 [IU] via SUBCUTANEOUS
  Administered 2018-07-18 – 2018-07-19 (×2): 2 [IU] via SUBCUTANEOUS
  Administered 2018-07-20 – 2018-07-23 (×8): 1 [IU] via SUBCUTANEOUS
  Filled 2018-07-18: qty 1

## 2018-07-18 MED ORDER — SODIUM CHLORIDE 0.9 % IV SOLN
INTRAVENOUS | Status: DC
  Administered 2018-07-18 – 2018-07-24 (×5): via INTRAVENOUS

## 2018-07-18 NOTE — ED Notes (Signed)
Echo at bedside

## 2018-07-18 NOTE — ED Notes (Signed)
Bed: WA27 Expected date:  Expected time:  Means of arrival:  Comments: Res A

## 2018-07-18 NOTE — Progress Notes (Signed)
Pt is resting well on room air and declined use of her home CPAP or hospital CPAP. RT will continue to monitor and will place on pt if needed or requested.

## 2018-07-18 NOTE — Progress Notes (Signed)
PROGRESS NOTE    Kristen Martinez  SUO:[id number] DOB: 1940-12-26 DOA: 07/17/2018 PCP: Patient, No Pcp Per    Brief Narrative:   78 year old female with history of dementia, type 2 diabetes, obstructive sleep apnea on CPAP who recently moved to [place] from [place] to live near to her daughter was brought to the emergency room with 2 days history of abdominal pain, nausea and multiple episodes of vomiting.  In the emergency room, she was found with elevated liver enzymes including elevated bilirubin, gallbladder stone with pericholecystic fluid as well as abnormal GE junction.  Followed by surgery and GI.  Assessment & Plan:   Principal Problem:   Choledocholithiasis with acute cholecystitis with obstruction Active Problems:   Cholecystitis   Liver disease   Hyponatremia   Prolonged QT interval   OSA on CPAP   Pericardial effusion   Dementia (HCC)   Ascites   Elevated LFTs   Obesity (BMI 30-39.9)  Acute right upper upper quadrant abdominal pain with nausea and vomiting, leukocytosis and abnormal LFTs: Suspected acute cholecystitis with choledocholithiasis.  Patient remains n.p.o., she is on adequate IV opiates for pain relief, on IV Zosyn.  Currently hemodynamically stable.  She is on PPI twice daily.  We will continue to monitor.  Undergoing MRCP today.  Followed by gastroenterology.  She will probably need ERCP for visualization of GE junction abnormality as well as relief of obstructive jaundice.  Abnormal LFTs: Probably due to above.  Also checking hepatitis panel.  She does have abnormal liver morphology.  Will recheck levels in the morning.  Family to get previous results.  Abnormal CT scan of the abdomen/GE junction abnormality with fluid around GE junction: Patient presented with multiple episodes of vomiting.  Suspected microperforation.  Followed by surgery.  Remains n.p.o., on IV fluids and PPI.  Hopefully, MRI of the abdomen will be more informative.  Patient will  ultimately need endoscopy and probable biopsy.  Addendum: MRI MRCP was reported.  It shows normal biliary duct.  MRI shows hemorrhages along the lesser curvature of the stomach, similar in size to last night's CT scan.  Source unknown.  Continue serial abdominal exam.  Hemoglobin has been stable.  We will recheck hemoglobin in the evening to ensure stabilization.  Further management as per surgery.  Hyponatremia: Suspect secondary to dehydration.  Will continue on isotonic saline and recheck level in the morning.  Acute kidney injury: Blood pressures are stable.  Continue on maintenance IV fluids.  Recheck level in the morning.  Obstructive sleep apnea: On CPAP at night.  She will continue to use her own CPAP.  Discussed with patient's daughter at the bedside.  She has been visiting from out of town.  Her daughter who she lives with is currently on vacation.  All questions were answered.  Plan of care discussed.  DVT prophylaxis: SCDs. Code Status: Full code. Family Communication: Daughter at the bedside. Disposition Plan: Unknown at this time.   Consultants:   Gastroenterology  General surgery  Procedures:   MRI  Antimicrobials:   Zosyn started 07/17/2018   Subjective: Patient was seen and examined.  She was still in the emergency room waiting for inpatient bed assignment.  Her daughter was at the bedside.  She had just received Dilaudid for abdominal pain and was sleeping.  She was awake on stimulation, however not very interactive.  Objective: Vitals:   07/18/18 0400 07/18/18 0600 07/18/18 1034 07/18/18 1412  BP: 127/65 124/63 114/79 (!) 124/108  Pulse: (!) 104 Marland Kitchen)  103 (!) 115 (!) 114  Resp: 17 (!) 25 19 16   Temp:   98 F (36.7 C) 98.4 F (36.9 C)  TempSrc:      SpO2: 94% 94% 100% 95%  Weight:      Height:       No intake or output data in the 24 hours ending 07/18/18 1436 Filed Weights   07/17/18 1225  Weight: 81.6 kg    Examination:  General exam: Appears  calm and comfortable .  Patient was sleepy. Respiratory system: Clear to auscultation. Respiratory effort normal. Cardiovascular system: S1 & S2 heard, RRR. No JVD, murmurs, rubs, gallops or clicks. No pedal edema. Gastrointestinal system: Abdomen is distended and pendulous but soft.  Mild tenderness epigastrium and right upper quadrant.  No organomegaly or masses felt. Normal bowel sounds heard. Central nervous system: Alert but not oriented.  Sleepy.  No focal neurological deficits. Extremities: Symmetric 5 x 5 power. Skin: No rashes, lesions or ulcers Psychiatry: Judgement and insight appear normal. Mood & affect flat. Sleepy.      Data Reviewed: I have personally reviewed following labs and imaging studies  CBC: Recent Labs  Lab 07/17/18 1247 07/18/18 0520  WBC 19.5* 21.1*  HGB 14.9 13.8  HCT 46.7* 42.9  MCV 84.4 85.1  PLT 420* 380   Basic Metabolic Panel: Recent Labs  Lab 07/17/18 1247 07/17/18 2123 07/18/18 0520  NA 125* 121* 126*  K 3.8 4.9 4.1  CL 91* 87* 92*  CO2 18* 20* 17*  GLUCOSE 182* 121* 110*  BUN 12 18 23   CREATININE 1.06* 1.19* 1.69*  CALCIUM 9.0 8.6* 8.7*  MG  --  1.7 1.7  PHOS  --  3.8 4.2   GFR: Estimated Creatinine Clearance: 28.5 mL/min (A) (by C-G formula based on SCr of 1.69 mg/dL (H)). Liver Function Tests: Recent Labs  Lab 07/17/18 1247 07/18/18 0520  AST 350* 140*  ALT 308* 203*  ALKPHOS 372* 308*  BILITOT 8.1* 7.9*  PROT 6.6 6.4*  ALBUMIN 3.2* 3.0*   Recent Labs  Lab 07/17/18 1247 07/18/18 0520  LIPASE 144* 59*   Recent Labs  Lab 07/17/18 2123  AMMONIA 37*   Coagulation Profile: Recent Labs  Lab 07/17/18 2123  INR 1.25   Cardiac Enzymes: Recent Labs  Lab 07/17/18 2123 07/18/18 0051 07/18/18 0520 07/18/18 1228  TROPONINI 0.03* 0.03* 0.03* <0.03   BNP (last 3 results) No results for input(s): PROBNP in the last 8760 hours. HbA1C: Recent Labs    07/18/18 0520  HGBA1C 6.3*   CBG: Recent Labs  Lab  07/18/18 0103 07/18/18 0351 07/18/18 0751 07/18/18 1123  GLUCAP 162* 111* 112* 115*   Lipid Profile: Recent Labs    07/18/18 0520  CHOL 243*  HDL 84  LDLCALC 136*  TRIG 113  CHOLHDL 2.9   Thyroid Function Tests: Recent Labs    07/18/18 0520  TSH 0.564   Anemia Panel: No results for input(s): VITAMINB12, FOLATE, FERRITIN, TIBC, IRON, RETICCTPCT in the last 72 hours. Sepsis Labs: Recent Labs  Lab 07/17/18 2123  PROCALCITON 13.99  LATICACIDVEN 1.5    No results found for this or any previous visit (from the past 240 hour(s)).       Radiology Studies: Dg Chest 2 View  Result Date: 07/17/2018 CLINICAL DATA:  Initial evaluation for acute shortness of breath. EXAM: CHEST - 2 VIEW COMPARISON:  None. FINDINGS: Cardiac and mediastinal silhouettes within normal limits. Lungs hypoinflated. Streaky bibasilar opacities, most consistent with atelectasis. No consolidative  opacity. No edema or pleural effusion. No pneumothorax. No acute osseous finding. Degenerative changes noted about the shoulders bilaterally. IMPRESSION: 1. Shallow lung inflation with streaky bibasilar atelectatic changes. 2. No other active cardiopulmonary disease. Electronically Signed   By: Rise Mu M.D.   On: 07/17/2018 13:25   US Abdomen Complete  Result Date: 07/17/2018 CLINICAL DATA:  Abdominal pain EXAM: ABDOMEN ULTRASOUND COMPLETE COMPARISON:  None. FINDINGS: Gallbladder: Gallbladder sludge with large calculus measuring 5 cm. No pericholecystic fluid. There is wall thickening measuring up to 10 mm fall on the hepatic margin. A negative sonographic Eulah Pont sign was reported by the sonographer. Common bile duct: Diameter: 4 mm Liver: Coarse hepatic echotexture. Small volume perihepatic ascites adjacent to the left hepatic lobe. There is hepatopetal blood flow within the portal vein. Small caliber adjacent vessels may indicate cavernous transformation. IVC: No abnormality visualized. Pancreas: Poorly  visualized due to body habitus. Spleen: Size and appearance within normal limits. Right Kidney: Length: 11.9 cm. Echogenicity within normal limits. No mass or hydronephrosis visualized. Left Kidney: Length: 12.3 cm. Echogenicity within normal limits. No mass or hydronephrosis visualized. There is a parapelvic cyst measuring 2.6 x 2.3 x 2.0 cm Abdominal aorta: Poorly visualized due to bowel gas. No visible aneurysm. Other findings: None. IMPRESSION: 1. Coarse hepatic echotexture with small amount of perihepatic ascites. This may indicate chronic liver disease, including hepatic cirrhosis. 2. Hepatopetal blood flow demonstrated within the portal vein. Small caliber nearby vessels may be a sign of cavernous transformation. Correlation with abdominal MRI with and without contrast might be helpful. 3. Gallbladder sludge with large calculus and wall thickening. Negative sonographic Murphy sign. Electronically Signed   By: Deatra Robinson M.D.   On: 07/17/2018 17:48   Ct Abdomen Pelvis W Contrast  Result Date: 07/17/2018 CLINICAL DATA:  Abdomen pain EXAM: CT ABDOMEN AND PELVIS WITH CONTRAST TECHNIQUE: Multidetector CT imaging of the abdomen and pelvis was performed using the standard protocol following bolus administration of intravenous contrast. CONTRAST:  ISOVUE-300 IOPAMIDOL (ISOVUE-300) INJECTION 61% COMPARISON:  Ultrasound 07/17/2018 FINDINGS: Lower chest: Lung bases demonstrate trace pleural effusions. Patchy atelectasis within the bilateral lung bases. Mild bronchial wall thickening. Borderline to mild cardiomegaly with trace pericardial effusion. Hepatobiliary: No focal hepatic abnormality. Fluid and edema along the falciform ligament. Distended gallbladder with faint stone. Wall thickening and surrounding pericholecystic fluid and inflammatory change. Pancreas: No ductal dilatation. Spleen: Normal in size without focal abnormality. Adrenals/Urinary Tract: Adrenal glands are normal. No hydronephrosis. Cyst  in the mid to lower left kidney. The bladder is normal. Stomach/Bowel: Very narrowed appearing GE junction. Large volume of fluid surrounding the GE junction and along the lesser curvature of the stomach. Mild antral wall thickening. No dilated small bowel. 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 Vascular/Lymphatic: Nonaneurysmal aorta.  No significant adenopathy. Reproductive: Uterus and bilateral adnexa are unremarkable. Other: Negative for free air.  Free fluid within the upper abdomen. Musculoskeletal: Degenerative changes without acute or suspicious abnormality. IMPRESSION: 1. Unusual constellation of findings. Moderate to large amount of fluid and edema at the GE junction and along the lesser curvature of the stomach; GE junction appears narrowed and wisp like. Findings could be secondary to micro perforation at the GE junction vs peptic ulcer disease, however no extraluminal gas is visualized. Endoscopy could be considered. Pancreatitis is also considered but felt less likely given the distribution of fluid and lack of edema/fluid immediately adjacent to the pancreas. Note that there is abnormal appearance of the gallbladder with  large stone, gallbladder wall thickening and pericholecystic fluid/edema suggesting possible cholecystitis. It would be unusual for gallbladder inflammatory change to account for the moderate to large volume of fluid near the stomach 2. Sigmoid colon diverticular disease without acute inflammatory change. 3. Trace pleural effusions. Atelectasis at both bases. Mild bronchial wall thickening at the lower lobes. Electronically Signed   By: Jasmine PangKim  Fujinaga M.D.   On: 07/17/2018 20:54   Mr Abdomen Mrcp Wo Contrast  Result Date: 07/18/2018 CLINICAL DATA:  Cholecystitis, cholelithiasis, fluid along the gastric wall and attic margin at CT, for further characterization. EXAM: MRI ABDOMEN WITHOUT CONTRAST  (INCLUDING MRCP) TECHNIQUE: Multiplanar multisequence MR  imaging of the abdomen was performed. Heavily T2-weighted images of the biliary and pancreatic ducts were obtained, and three-dimensional MRCP images were rendered by post processing. COMPARISON:  CT scan dated 07/17/2018 IV contrast could not be administered due to renal insufficiency. FINDINGS: Lower chest: Small bilateral pleural effusions. Mild cardiomegaly. Mild atelectasis in both lung bases. Hepatobiliary: Mild perihepatic ascites. Mild periportal edema carried no focal liver lesion is identified on today's noncontrast MRI. No biliary dilatation is observed. This 5.1 cm in long axis gallstone in the gallbladder along with layering sludge in the gallbladder. Gallbladder wall thickening is present. Pancreas: Trace peripancreatic stranding is thought to be from extrinsic causes rather than acute pancreatitis, although correlation with lipase levels would be suggested. No dorsal pancreatic duct dilatation. Spleen:  Unremarkable Adrenals/Urinary Tract: Although some of the hemorrhagic or proteinaceous fluid from around the stomach tracks around the lateral limb of the left adrenal gland, I am skeptical that the left adrenal gland is the source of the hemorrhage. There is some tracking of blood products along the top of the left perirenal space and down the retrorenal space. The kidney does not appear to be the epicenter of this process. Suspected cyst of the left mid kidney anteriorly. Stomach/Bowel: There is infiltrative fluid density with high T1 signal especially along the margins of the stomach including the lesser sac margin and inferior margin as well as the posterior margin. This also tracks up partially adjacent to the spleen, left adrenal gland, and around the left perirenal space margins as noted above. The gastroesophageal junction is surrounded by some of this complex fluid, which also tracks down around the falciform ligament and anterior margin of the lateral segment left hepatic lobe. The appearance  favors hemorrhage given the high precontrast T1 signal. I do not identify a discrete mass of the distal esophagus or gastric wall or of the adrenal gland 2 account for this apparent acute hemorrhage. I do not see findings of extraluminal gas to suggest a perforated ulcer. There is a considerable degree of colonic diverticulosis but I do not see active diverticulitis in the upper abdomen. Vascular/Lymphatic: No pathologic adenopathy. Vascular contours appear normal. On the recent CT scan I do not observe definite active extravasation of contrast. The portal vein and its tributaries are mildly narrowed but without cavernous transformation. Other: In addition to the complex fluid noted above, there is a small amount of perihepatic ascites which is not complex. Musculoskeletal: Unremarkable IMPRESSION: 1. Abnormal complex fluid with high precontrast T1 signal favoring acute hemorrhage along the margin of the stomach, gastroesophageal junction, and tracking around the left hepatic lobe and falciform ligament. Some of this process also tracks around the left perirenal space down into the left retro renal space. The origin of the acute hemorrhage is not identified although the epicenter of the process is probably along  the proximal gastric wall. Today's exam does not have IV contrast, but the prior CT scan did not appear to show active extravasation of contrast in the amount of hemorrhage appears similar to last night's exam. No extraluminal gas to suggest a perforated ulcer. The exact cause and origin of the hemorrhage is uncertain. 2. No overt nodularity of the liver or definite morphologic findings of cirrhosis. 3. Gallbladder wall thickening with cholelithiasis and sludge-correlate clinically in assessing for acute cholecystitis. 4. Small bilateral pleural effusions with mild cardiomegaly. 5. In addition to the above-noted hemorrhage, there is some mild simple perihepatic ascites which does not include blood products.  6. Considerable colonic diverticulosis. Electronically Signed   By: Gaylyn Rong M.D.   On: 07/18/2018 13:39   Mr 3d Recon At Scanner  Result Date: 07/18/2018 CLINICAL DATA:  Cholecystitis, cholelithiasis, fluid along the gastric wall and attic margin at CT, for further characterization. EXAM: MRI ABDOMEN WITHOUT CONTRAST  (INCLUDING MRCP) TECHNIQUE: Multiplanar multisequence MR imaging of the abdomen was performed. Heavily T2-weighted images of the biliary and pancreatic ducts were obtained, and three-dimensional MRCP images were rendered by post processing. COMPARISON:  CT scan dated 07/17/2018 IV contrast could not be administered due to renal insufficiency. FINDINGS: Lower chest: Small bilateral pleural effusions. Mild cardiomegaly. Mild atelectasis in both lung bases. Hepatobiliary: Mild perihepatic ascites. Mild periportal edema carried no focal liver lesion is identified on today's noncontrast MRI. No biliary dilatation is observed. This 5.1 cm in long axis gallstone in the gallbladder along with layering sludge in the gallbladder. Gallbladder wall thickening is present. Pancreas: Trace peripancreatic stranding is thought to be from extrinsic causes rather than acute pancreatitis, although correlation with lipase levels would be suggested. No dorsal pancreatic duct dilatation. Spleen:  Unremarkable Adrenals/Urinary Tract: Although some of the hemorrhagic or proteinaceous fluid from around the stomach tracks around the lateral limb of the left adrenal gland, I am skeptical that the left adrenal gland is the source of the hemorrhage. There is some tracking of blood products along the top of the left perirenal space and down the retrorenal space. The kidney does not appear to be the epicenter of this process. Suspected cyst of the left mid kidney anteriorly. Stomach/Bowel: There is infiltrative fluid density with high T1 signal especially along the margins of the stomach including the lesser sac margin  and inferior margin as well as the posterior margin. This also tracks up partially adjacent to the spleen, left adrenal gland, and around the left perirenal space margins as noted above. The gastroesophageal junction is surrounded by some of this complex fluid, which also tracks down around the falciform ligament and anterior margin of the lateral segment left hepatic lobe. The appearance favors hemorrhage given the high precontrast T1 signal. I do not identify a discrete mass of the distal esophagus or gastric wall or of the adrenal gland 2 account for this apparent acute hemorrhage. I do not see findings of extraluminal gas to suggest a perforated ulcer. There is a considerable degree of colonic diverticulosis but I do not see active diverticulitis in the upper abdomen. Vascular/Lymphatic: No pathologic adenopathy. Vascular contours appear normal. On the recent CT scan I do not observe definite active extravasation of contrast. The portal vein and its tributaries are mildly narrowed but without cavernous transformation. Other: In addition to the complex fluid noted above, there is a small amount of perihepatic ascites which is not complex. Musculoskeletal: Unremarkable IMPRESSION: 1. Abnormal complex fluid with high precontrast T1 signal favoring  acute hemorrhage along the margin of the stomach, gastroesophageal junction, and tracking around the left hepatic lobe and falciform ligament. Some of this process also tracks around the left perirenal space down into the left retro renal space. The origin of the acute hemorrhage is not identified although the epicenter of the process is probably along the proximal gastric wall. Today's exam does not have IV contrast, but the prior CT scan did not appear to show active extravasation of contrast in the amount of hemorrhage appears similar to last night's exam. No extraluminal gas to suggest a perforated ulcer. The exact cause and origin of the hemorrhage is uncertain. 2.  No overt nodularity of the liver or definite morphologic findings of cirrhosis. 3. Gallbladder wall thickening with cholelithiasis and sludge-correlate clinically in assessing for acute cholecystitis. 4. Small bilateral pleural effusions with mild cardiomegaly. 5. In addition to the above-noted hemorrhage, there is some mild simple perihepatic ascites which does not include blood products. 6. Considerable colonic diverticulosis. Electronically Signed   By: Gaylyn Rong M.D.   On: 07/18/2018 13:39        Scheduled Meds: . insulin aspart  0-9 Units Subcutaneous Q4H  . pantoprazole (PROTONIX) IV  40 mg Intravenous Q12H   Continuous Infusions: . sodium chloride    . piperacillin-tazobactam (ZOSYN)  IV 3.375 g (07/18/18 0620)     LOS: 1 day    Time spent: 22    Dorcas Carrow, MD [facility] Pager [phone]  If 7PM-7AM, please contact night-coverage [email or web address] Password [email or web address] 07/18/2018, 2:36 PM

## 2018-07-18 NOTE — ED Notes (Signed)
GI NP into see

## 2018-07-18 NOTE — Progress Notes (Signed)
Received Kristen Martinez from the main ED, she is alert and cooperative. She is on the monitor and respiratory set her up with  the CPAP.  She stated her pain score is a 6/10 to the upper abdomen. She is currently resting peacefully in the bed with the CPAP on.

## 2018-07-18 NOTE — ED Notes (Signed)
Patient transported to MRI 

## 2018-07-18 NOTE — Consult Note (Addendum)
Reason for Consult: abdominal pain Referring Physician: Dorcas Carrow CC: Short of breath x2 days, vomiting with abdominal pain on palpation  Kristen Martinez is an 78 y.o. female.   HPI: Patient presents to the ED 07/17/2018 with a complaint of shortness of breath, about 5 days of right upper quadrant and epigastric abdominal pain.  It comes and goes several times a day.  She has also had some nonbilious emesis with some fevers, no temperature documented but she describes chills and shakes.  She got worse on 1/28, and finally came to the ED.  She is very sleepy and feels somewhat better now. She has a history of borderline diabetes, early dementia and OSA on CPAP.She has a hx of GERD but this feels different.    Work-up in the ED: Afebrile tachycardic, elevated respiratory rate 20-30 range.  Sats 93 to 96% on room air. Labs last evening shows a sodium of 121, potassium 4.9, chloride 87, CO2 20, glucose 121, creatinine 1.19, calcium 8.6, magnesium 1.7 ammonia 37, total bilirubin 8.1, ALT 308, AST 350, lipase 144.  Troponin 0 0.032, lactic acid 1.5 prolactin 13.99.  WBC 19.5, hemoglobin 14.9, hematocrit 46.7, platelets 420,000.  INR is 1.25, urinalysis was normal.  Lipase was 144.  Drug screen was positive for opiates and THC.  Chest x-ray shows some basilar atelectatic changes.  Nominal ultrasound shows coarse hepatic echotexture the small amount of peri-hepatic ascites, this may be chronic liver disease including possibly cirrhosis.  Hepatopetal blood flow demonstrated within the portal vein small caliber nearby vessels may be side of cavernous transformation.  CT scan: No focal hepatic abnormality.  Fluid and edema along the falciform ligament distended gallbladder with faint stone.  Wall thickening and surrounding pericholecystic fluid inflammatory changes.  No ductal dilatation of the pancreas.  Spleen was normal.  There is a large volume of fluid surrounding the GE junction along the lesser curvature the  stomach mild antral wall thickening, no dilated small bowel diverticular disease of colon without acute inflammatory changes.  Conclusion was that there is moderate large amount of fluid and edema at the GE junction along the lesser curve findings could be secondary to a microperforation of the GE junction versus peptic ulcer disease.  No extraluminal gas was visualized.  Otitis was also considered and felt to be less likely given the distribution of fluid.  Gallbladder wall thickening and pericholecystic fluid/edema suggestive of possible cholecystitis.  Also a large stone.  It would be unusual for gallbladder inflammatorychange to account for the moderate to large volume of fluid near the Stomach.  Pt has been seen by GI and Dr. Margretta Sidle.  MRCP and CT reviewed together.  This shows a T1 signal consistent with an acute hemorrhage along the margin of the stomach and GE junction tracking down around the left hepatic lobe, and falciform ligament, some down to the left pari-renal space and into retro-renal space.  No origin of the hemorrhage is identified.  Epicenter is probably along the proximal gastric wall.  Liver was fine, no cirrhosis, GB wall thickening and cholelithiasis stone up to 5.1 cm with sludge/  Trace peripancreatic stranding (? Pancreatitis?)  Past Medical History:  Diagnosis Date  . Diabetes mellitus without complication (HCC)   . Hypertension     History reviewed. No pertinent surgical history.  Family History  Problem Relation Age of Onset  . Diabetes Mother   . Hypertension Father   . Breast cancer Other   . Diabetes Other   . Stroke  Neg Hx   . CAD Neg Hx   . Liver disease Neg Hx     Social History:  reports that she has never smoked. She has never used smokeless tobacco. She reports that she does not drink alcohol or use drugs. 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 Penicillins Nausea And Vomiting    Did it involve swelling of the face/tongue/throat, SOB, or low BP? No Did it involve sudden or severe rash/hives, skin peeling, or any reaction on the inside of your mouth or nose? No Did you need to seek medical attention at a hospital or doctor's office? Yes When did it last happen?more than 10 years ago as of present date January 2020 If all above answers are "NO", may proceed with cephalosporin use.  Pt does report she experiences a particular taste in her mouth   . Prednisone     Tongue swelling    Medications:  Prior to Admission medications   Medication Sig Start Date End Date Taking? Authorizing Provider  calcium carbonate (TUMS - DOSED IN MG ELEMENTAL CALCIUM) 500 MG chewable tablet Chew 1 tablet by mouth at bedtime.   Yes [provider]  donepezil (ARICEPT) 10 MG tablet Take 10 mg by mouth at bedtime.   Yes [provider]  DULoxetine (CYMBALTA) 20 MG capsule Take 20 mg by mouth at bedtime.   Yes [provider]  mirtazapine (REMERON) 15 MG tablet Take 15 mg by mouth at bedtime.   Yes [provider]  omeprazole (PRILOSEC) 20 MG capsule Take 20 mg by mouth at bedtime.   Yes [provider]  oxybutynin (DITROPAN) 5 MG tablet Take 5 mg by mouth at bedtime.   Yes [provider]     Scheduled: . insulin aspart  0-9 Units Subcutaneous Q4H  . iopamidol      . pantoprazole (PROTONIX) IV  40 mg Intravenous Q12H  . sodium chloride (PF)       Continuous: . sodium chloride 75 mL/hr at 07/18/18 0052  . piperacillin-tazobactam (ZOSYN)  IV 3.375 g (07/18/18 0620)   Anti-infectives (From admission, onward)   Start     Dose/Rate Route Frequency Ordered Stop   07/18/18 0600  piperacillin-tazobactam (ZOSYN) IVPB 3.375 g     3.375 g 12.5 mL/hr over 240 Minutes Intravenous Every 8 hours 07/17/18 2008     07/17/18 2030  piperacillin-tazobactam (ZOSYN) IVPB  3.375 g     3.375 g 100 mL/hr over 30 Minutes Intravenous  Once 07/17/18 2006 07/17/18 2242   07/17/18 1830  ciprofloxacin (CIPRO) IVPB 400 mg     400 mg 200 mL/hr over 60 Minutes Intravenous  Once 07/17/18 1822 07/17/18 2154      Results for orders placed or performed during the hospital encounter of 07/17/18 (from the past 48 hour(s))  Lipase, blood     Status: Abnormal   Collection Time: 07/17/18 12:47 PM  Result Value Ref Range   Lipase 144 (H) 11 - 51 U/L    Comment: Performed at [facility], [address] [place], [place] [address]  Comprehensive metabolic panel     Status: Abnormal   Collection Time: 07/17/18 12:47 PM  Result Value Ref Range   Sodium 125 (L) 135 - 145 mmol/L   Potassium 3.8 3.5 - 5.1 mmol/L  Chloride 91 (L) 98 - 111 mmol/L   CO2 18 (L) 22 - 32 mmol/L   Glucose, Bld 182 (H) 70 - 99 mg/dL   BUN 12 8 - 23 mg/dL   Creatinine, Ser 1.61 (H) 0.44 - 1.00 mg/dL   Calcium 9.0 8.9 - 09.6 mg/dL   Total Protein 6.6 6.5 - 8.1 g/dL   Albumin 3.2 (L) 3.5 - 5.0 g/dL   AST 045 (H) 15 - 41 U/L   ALT 308 (H) 0 - 44 U/L   Alkaline Phosphatase 372 (H) 38 - 126 U/L   Total Bilirubin 8.1 (H) 0.3 - 1.2 mg/dL   GFR calc non Af Amer 51 (L) >60 mL/min   GFR calc Af Amer 59 (L) >60 mL/min   Anion gap 16 (H) 5 - 15    Comment: Performed at [facility], [address], [place], [place] [address]  CBC     Status: Abnormal   Collection Time: 07/17/18 12:47 PM  Result Value Ref Range   WBC 19.5 (H) 4.0 - 10.5 K/uL   RBC 5.53 (H) 3.87 - 5.11 MIL/uL   Hemoglobin 14.9 12.0 - 15.0 g/dL   HCT 19.1 (H) 47.8 - 29.5 %   MCV 84.4 80.0 - 100.0 fL   MCH 26.9 26.0 - 34.0 pg   MCHC 31.9 30.0 - 36.0 g/dL   RDW 62.1 30.8 - 65.7 %   Platelets 420 (H) 150 - 400 K/uL   nRBC 0.0 0.0 - 0.2 %    Comment: Performed at [facility], [address], [place], [place] [address]  Urinalysis, Routine w reflex microscopic     Status: Abnormal    Collection Time: 07/17/18  3:52 PM  Result Value Ref Range   Color, Urine STRAW (A) YELLOW   APPearance CLEAR CLEAR   Specific Gravity, Urine 1.000 (L) 1.005 - 1.030   pH 6.0 5.0 - 8.0   Glucose, UA NEGATIVE NEGATIVE mg/dL   Hgb urine dipstick NEGATIVE NEGATIVE   Bilirubin Urine NEGATIVE NEGATIVE   Ketones, ur NEGATIVE NEGATIVE mg/dL   Protein, ur NEGATIVE NEGATIVE mg/dL   Nitrite NEGATIVE NEGATIVE   Leukocytes, UA TRACE (A) NEGATIVE   RBC / HPF 0-5 0 - 5 RBC/hpf   WBC, UA 0-5 0 - 5 WBC/hpf   Bacteria, UA NONE SEEN NONE SEEN    Comment: Performed at [facility], [address], [place], [place] [address]  Urine rapid drug screen (hosp performed)     Status: Abnormal   Collection Time: 07/17/18  7:02 PM  Result Value Ref Range   Opiates POSITIVE (A) NONE DETECTED   Cocaine NONE DETECTED NONE DETECTED   Benzodiazepines NONE DETECTED NONE DETECTED   Amphetamines NONE DETECTED NONE DETECTED   Tetrahydrocannabinol POSITIVE (A) NONE DETECTED   Barbiturates NONE DETECTED NONE DETECTED    Comment: (NOTE) DRUG SCREEN FOR MEDICAL PURPOSES ONLY.  IF CONFIRMATION IS NEEDED FOR ANY PURPOSE, NOTIFY LAB WITHIN 5 DAYS. LOWEST DETECTABLE LIMITS FOR URINE DRUG SCREEN Drug Class                     Cutoff (ng/mL) Amphetamine and metabolites    1000 Barbiturate and metabolites    200 Benzodiazepine                 200 Tricyclics and metabolites     300 Opiates and metabolites        300 Cocaine and metabolites  300 THC                            50 Performed at [facility], [address] [place], [place] [address]   Ethanol     Status: None   Collection Time: 07/17/18  9:23 PM  Result Value Ref Range   Alcohol, Ethyl (B) <10 <10 mg/dL    Comment: (NOTE) Lowest detectable limit for serum alcohol is 10 mg/dL. For medical purposes only. Performed at [facility], [address], [place], [place] [address]    Protime-INR     Status: Abnormal   Collection Time: 07/17/18  9:23 PM  Result Value Ref Range   Prothrombin Time 15.6 (H) 11.4 - 15.2 seconds   INR 1.25     Comment: Performed at [facility], [address] [place], [place] [address]  Acetaminophen level     Status: Abnormal   Collection Time: 07/17/18  9:23 PM  Result Value Ref Range   Acetaminophen (Tylenol), Serum <10 (L) 10 - 30 ug/mL    Comment: (NOTE) Therapeutic concentrations vary significantly. A range of 10-30 ug/mL  may be an effective concentration for many patients. However, some  are best treated at concentrations outside of this range. Acetaminophen concentrations >150 ug/mL at 4 hours after ingestion  and >50 ug/mL at 12 hours after ingestion are often associated with  toxic reactions. Performed at [facility], [address], [place], [place] [address]   Basic metabolic panel     Status: Abnormal   Collection Time: 07/17/18  9:23 PM  Result Value Ref Range   Sodium 121 (L) 135 - 145 mmol/L   Potassium 4.9 3.5 - 5.1 mmol/L    Comment: DELTA CHECK NOTED   Chloride 87 (L) 98 - 111 mmol/L    Comment: NO VISIBLE HEMOLYSIS   CO2 20 (L) 22 - 32 mmol/L   Glucose, Bld 121 (H) 70 - 99 mg/dL   BUN 18 8 - 23 mg/dL   Creatinine, Ser 2.53 (H) 0.44 - 1.00 mg/dL   Calcium 8.6 (L) 8.9 - 10.3 mg/dL   GFR calc non Af Amer 45 (L) >60 mL/min   GFR calc Af Amer 52 (L) >60 mL/min   Anion gap 14 5 - 15    Comment: Performed at [facility], [address] [place], [place] [address]  Troponin I - Add-On to previous collection     Status: Abnormal   Collection Time: 07/17/18  9:23 PM  Result Value Ref Range   Troponin I 0.03 (HH) <0.03 ng/mL    Comment: CRITICAL RESULT CALLED TO, READ BACK BY AND VERIFIED WITH: RN B.JESSE AT 2236 07/17/18 CRUICKSHANK A Performed at [facility], [address], [place], [place] [address]   Magnesium     Status: None    Collection Time: 07/17/18  9:23 PM  Result Value Ref Range   Magnesium 1.7 1.7 - 2.4 mg/dL    Comment: Performed at [facility], [address] [place], [place] [address]  Phosphorus     Status: None   Collection Time: 07/17/18  9:23 PM  Result Value Ref Range   Phosphorus 3.8 2.5 - 4.6 mg/dL    Comment: Performed at [facility], [address], [place], [place] [address]  Lactic acid, plasma     Status: None   Collection Time: 07/17/18  9:23 PM  Result Value Ref Range   Lactic Acid, Venous 1.5 0.5 - 1.9 mmol/L    Comment: Performed at [facility], [address] [place], [place] [address]  Procalcitonin     Status: None   Collection Time: 07/17/18  9:23 PM  Result Value Ref Range   Procalcitonin 13.99 ng/mL    Comment:        Interpretation: PCT >= 10 ng/mL: Important systemic inflammatory response, almost exclusively due to severe bacterial sepsis or septic shock. (NOTE)       Sepsis PCT Algorithm           Lower Respiratory Tract                                      Infection PCT Algorithm    ----------------------------     ----------------------------         PCT < 0.25 ng/mL                PCT < 0.10 ng/mL         Strongly encourage             Strongly discourage   discontinuation of antibiotics    initiation of antibiotics    ----------------------------     -----------------------------       PCT 0.25 - 0.50 ng/mL            PCT 0.10 - 0.25 ng/mL               OR       >80% decrease in PCT            Discourage initiation of                                            antibiotics      Encourage discontinuation           of antibiotics    ----------------------------     -----------------------------         PCT >= 0.50 ng/mL              PCT 0.26 - 0.50 ng/mL                AND       <80% decrease in PCT             Encourage initiation of                                             antibiotics        Encourage continuation           of antibiotics    ----------------------------     -----------------------------        PCT >= 0.50 ng/mL                  PCT > 0.50 ng/mL               AND         increase in PCT                  Strongly encourage  initiation of antibiotics    Strongly encourage escalation           of antibiotics                                     -----------------------------                                           PCT <= 0.25 ng/mL                                                 OR                                        > 80% decrease in PCT                                     Discontinue / Do not initiate                                             antibiotics Performed at [facility], [address] [place], [place] [address]   Ammonia     Status: Abnormal   Collection Time: 07/17/18  9:23 PM  Result Value Ref Range   Ammonia 37 (H) 9 - 35 umol/L    Comment: Performed at [facility], [address] [place], [place] [address]  Creatinine, urine, random     Status: None   Collection Time: 07/17/18  9:23 PM  Result Value Ref Range   Creatinine, Urine 93.24 mg/dL    Comment: Performed at [facility], [address] [place], [place] [address]  Sodium, urine, random     Status: None   Collection Time: 07/17/18  9:23 PM  Result Value Ref Range   Sodium, Ur <10 mmol/L    Comment: Performed at [facility], [address] [place], [place] [address]  Troponin I - Now Then Q6H     Status: Abnormal   Collection Time: 07/18/18 12:51 AM  Result Value Ref Range   Troponin I 0.03 (HH) <0.03 ng/mL    Comment: CRITICAL VALUE NOTED.  VALUE IS CONSISTENT WITH PREVIOUSLY REPORTED AND CALLED VALUE. Performed at [facility], [address] [place], [place] [address]   CBG monitoring, ED     Status: Abnormal   Collection Time:  07/18/18  1:03 AM  Result Value Ref Range   Glucose-Capillary 162 (H) 70 - 99 mg/dL  CBG monitoring, ED     Status: Abnormal   Collection Time: 07/18/18  3:51 AM  Result Value Ref Range   Glucose-Capillary 111 (H) 70 - 99 mg/dL   Comment 1 Notify RN    Comment 2 Document in Chart   CBC     Status: Abnormal   Collection Time: 07/18/18  5:20 AM  Result Value Ref  Range   WBC 21.1 (H) 4.0 - 10.5 K/uL   RBC 5.04 3.87 - 5.11 MIL/uL   Hemoglobin 13.8 12.0 - 15.0 g/dL   HCT 16.1 09.6 - 04.5 %   MCV 85.1 80.0 - 100.0 fL   MCH 27.4 26.0 - 34.0 pg   MCHC 32.2 30.0 - 36.0 g/dL   RDW 40.9 81.1 - 91.4 %   Platelets 380 150 - 400 K/uL   nRBC 0.0 0.0 - 0.2 %    Comment: Performed at [facility], [address], [place], [place] [address]    Dg Chest 2 View  Result Date: 07/17/2018 CLINICAL DATA:  Initial evaluation for acute shortness of breath. EXAM: CHEST - 2 VIEW COMPARISON:  None. FINDINGS: Cardiac and mediastinal silhouettes within normal limits. Lungs hypoinflated. Streaky bibasilar opacities, most consistent with atelectasis. No consolidative opacity. No edema or pleural effusion. No pneumothorax. No acute osseous finding. Degenerative changes noted about the shoulders bilaterally. IMPRESSION: 1. Shallow lung inflation with streaky bibasilar atelectatic changes. 2. No other active cardiopulmonary disease. Electronically Signed   By: Rise Mu M.D.   On: 07/17/2018 13:25   US Abdomen Complete  Result Date: 07/17/2018 CLINICAL DATA:  Abdominal pain EXAM: ABDOMEN ULTRASOUND COMPLETE COMPARISON:  None. FINDINGS: Gallbladder: Gallbladder sludge with large calculus measuring 5 cm. No pericholecystic fluid. There is wall thickening measuring up to 10 mm fall on the hepatic margin. A negative sonographic Eulah Pont sign was reported by the sonographer. Common bile duct: Diameter: 4 mm Liver: Coarse hepatic echotexture. Small volume perihepatic ascites adjacent to the left  hepatic lobe. There is hepatopetal blood flow within the portal vein. Small caliber adjacent vessels may indicate cavernous transformation. IVC: No abnormality visualized. Pancreas: Poorly visualized due to body habitus. Spleen: Size and appearance within normal limits. Right Kidney: Length: 11.9 cm. Echogenicity within normal limits. No mass or hydronephrosis visualized. Left Kidney: Length: 12.3 cm. Echogenicity within normal limits. No mass or hydronephrosis visualized. There is a parapelvic cyst measuring 2.6 x 2.3 x 2.0 cm Abdominal aorta: Poorly visualized due to bowel gas. No visible aneurysm. Other findings: None. IMPRESSION: 1. Coarse hepatic echotexture with small amount of perihepatic ascites. This may indicate chronic liver disease, including hepatic cirrhosis. 2. Hepatopetal blood flow demonstrated within the portal vein. Small caliber nearby vessels may be a sign of cavernous transformation. Correlation with abdominal MRI with and without contrast might be helpful. 3. Gallbladder sludge with large calculus and wall thickening. Negative sonographic Murphy sign. Electronically Signed   By: Deatra Robinson M.D.   On: 07/17/2018 17:48   Ct Abdomen Pelvis W Contrast  Result Date: 07/17/2018 CLINICAL DATA:  Abdomen pain EXAM: CT ABDOMEN AND PELVIS WITH CONTRAST TECHNIQUE: Multidetector CT imaging of the abdomen and pelvis was performed using the standard protocol following bolus administration of intravenous contrast. CONTRAST:  ISOVUE-300 IOPAMIDOL (ISOVUE-300) INJECTION 61% COMPARISON:  Ultrasound 07/17/2018 FINDINGS: Lower chest: Lung bases demonstrate trace pleural effusions. Patchy atelectasis within the bilateral lung bases. Mild bronchial wall thickening. Borderline to mild cardiomegaly with trace pericardial effusion. Hepatobiliary: No focal hepatic abnormality. Fluid and edema along the falciform ligament. Distended gallbladder with faint stone. Wall thickening and surrounding  pericholecystic fluid and inflammatory change. Pancreas: No ductal dilatation. Spleen: Normal in size without focal abnormality. Adrenals/Urinary Tract: Adrenal glands are normal. No hydronephrosis. Cyst in the mid to lower left kidney. The bladder is normal. Stomach/Bowel: Very narrowed appearing GE junction. Large volume of fluid surrounding the GE junction and along  the lesser curvature of the stomach. Mild antral wall thickening. No dilated small bowel. 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 Vascular/Lymphatic: Nonaneurysmal aorta.  No significant adenopathy. Reproductive: Uterus and bilateral adnexa are unremarkable. Other: Negative for free air.  Free fluid within the upper abdomen. Musculoskeletal: Degenerative changes without acute or suspicious abnormality. IMPRESSION: 1. Unusual constellation of findings. Moderate to large amount of fluid and edema at the GE junction and along the lesser curvature of the stomach; GE junction appears narrowed and wisp like. Findings could be secondary to micro perforation at the GE junction vs peptic ulcer disease, however no extraluminal gas is visualized. Endoscopy could be considered. Pancreatitis is also considered but felt less likely given the distribution of fluid and lack of edema/fluid immediately adjacent to the pancreas. Note that there is abnormal appearance of the gallbladder with large stone, gallbladder wall thickening and pericholecystic fluid/edema suggesting possible cholecystitis. It would be unusual for gallbladder inflammatory change to account for the moderate to large volume of fluid near the stomach 2. Sigmoid colon diverticular disease without acute inflammatory change. 3. Trace pleural effusions. Atelectasis at both bases. Mild bronchial wall thickening at the lower lobes. Electronically Signed   By: Jasmine Pang M.D.   On: 07/17/2018 20:54    Review of Systems  Constitutional: Positive for chills. Negative for  diaphoresis, malaise/fatigue and weight loss.       No documented fever, but reports chills and sweats a few days ago  HENT: Negative.        Complains of spitting up a lot of mucus when sleeping and at night.  She has sleep apnea and is on a CPAP machine.    Eyes: Negative.   Respiratory: Positive for sputum production. Negative for cough, hemoptysis, shortness of breath and wheezing.   Cardiovascular: Positive for PND (she does wear a CPAP at night ).  Gastrointestinal: Positive for abdominal pain (she has some diffuse tenderness, but most of it is curently just below the xyphoid.), constipation, diarrhea, heartburn (On TUMS and Prilosec), nausea and vomiting (nausea and vomiting was at home, none here.). Negative for blood in stool and melena.       Colonoscopy ~ 5 years ago in [place]. Nausea and vomiting at home, none here.  Pain much worse at home than here and now.  Genitourinary: Positive for frequency and urgency. Negative for flank pain and hematuria.       Dark urine - she has had 6 children and has chronic bladder leakage  Musculoskeletal: Negative.   Skin: Negative.   Neurological: Negative.   Endo/Heme/Allergies: Negative.   Psychiatric/Behavioral: Positive for depression.   Blood pressure 127/65, pulse (!) 104, temperature 98.4 F (36.9 C), temperature source Oral, resp. rate 17, height 5\' 2"  (1.575 m), weight 81.6 kg, SpO2 94 %. Physical Exam  Constitutional: She is oriented to person, place, and time. No distress.  Obese female, tachycardic, feels warm,but no distress, hard to keep her awake to talk with me.    HENT:  Head: Normocephalic and atraumatic.  Mouth/Throat: Oropharynx is clear and moist. No oropharyngeal exudate.  Eyes: Right eye exhibits no discharge. Left eye exhibits no discharge. No scleral icterus.  Pupils are equal  Neck: Normal range of motion. Neck supple. No JVD present. No tracheal deviation present. No thyromegaly present.  Cardiovascular: Regular  rhythm, normal heart sounds and intact distal pulses.  No murmur heard. HR about 120  Respiratory: Effort normal and breath sounds normal. No respiratory distress.  She has no wheezes. She has no rales. She exhibits no tenderness.  GI: Soft. She exhibits distension (very large abdomen, cant really tell if she is distended.). She exhibits no mass. There is abdominal tenderness (diffusely tender, but says it is much better.  On exam her  worst pain is mid epigastric area just under the xyphoid). There is no rebound and no guarding.  Musculoskeletal:        General: No tenderness or edema.  Lymphadenopathy:    She has no cervical adenopathy.  Neurological: She is alert and oriented to person, place, and time. No cranial nerve deficit.  Skin: Skin is warm and dry. No rash noted. She is not diaphoretic. No erythema. No pallor.  Psychiatric: She has a normal mood and affect. Her behavior is normal. Judgment and thought content normal.  Says she has trouble with depression    Assessment/Plan: Abdominal pain, nausea and vomiting cholelithiasis with sludge Mild pancreatitis - resolving 144>>59 Acute hemorrhage along the margins of the stomach/GE junction - uncertain etiology Hyperbilirubinemia 8.1>>7.9 - uncertain etiology Hyperlipidemia + THC Elevated troponin 0.03 x 2, <0.03 x 1 Hyponatremia 125>>121>>126 Prediabetes - Glucose monitoring Leukocytosis - uncertain etilology AKI - creatinine 1.06>>1.19>>1.69    Plan:  For now;  continue to monitor progress, agree with antibiotics, NPO, PPI, keep her on q2h VS, recheck labs, consider increasing fluids.  Keep her NPO, I think she needs her GB out eventually.  I don't know how to explain the blood noted on the MRI.  She may need to go to the OR tomorrow. Dr. Jerral Ralph is going to increase fluids for her now.  Jonelle Bann 07/18/2018, 7:06 AM

## 2018-07-18 NOTE — ED Notes (Signed)
Patient c/o abdominal pain 6/10. Paged Craige Cotta. Will continue to monitor.

## 2018-07-18 NOTE — Consult Note (Signed)
Referring Provider: No ref. provider found Primary Care Physician:  Patient, No Pcp Per Primary Gastroenterologist:  Dr. Hilarie Fredrickson  Reason for Consultation:  Acute Cholecystitis, possible choledocolithiasis  HPI: Kristen Martinez is a 78 y.o. female with a past medical history of borderline DM II, early dementia and PSA on Cpap. She presented to the ED with complaints of RUQ and epigastric pain for 2 days and vomiting as reported by the ED physician. She was SOB which has resolved. No known history of gallstones or liver disease. Her nurse reported she ambulated to the bathroom, passed urine and a small brown formed stool this am. She received Ativan 0.67m IV at 7:45am for aggitation, she is mildly sedated, answering yes or no to questions without further response. No family at the bed side.   ED course: See HPI. CMP: Na 125. K 3.8. BUN 12. Cr. 1.06. Alk phos 372. T. Bili 8.1. AST 350. ALT 308. Albumin 3.2. Ammonia 37. Lipse 144. PT 15.6. INR 1.25. CBC: WBC 19.5. Hg 14.9. HCT 46.7. PLT 420. Lactic acid 1.5.  Abdominal/pelvic CT with IV contrast:  IMPRESSION: 1. Unusual constellation of findings. Moderate to large amount of fluid and edema at the GE junction and along the lesser curvature of the stomach; GE junction appears narrowed and wisp like. Findings could be secondary to micro perforation at the GE junction vs peptic ulcer disease, however no extraluminal gas is visualized. Endoscopy could be considered. Pancreatitis is also considered but felt less likely given the distribution of fluid and lack of edema/fluid immediately adjacent to the pancreas. Note that there is abnormal appearance of the gallbladder with large stone, gallbladder wall thickening and pericholecystic fluid/edema suggesting possible cholecystitis. It would be unusual for gallbladder inflammatory change to account for the moderate to large volume of fluid near the stomach 2. Sigmoid Martinez diverticular disease without  acute inflammatory change. 3. Trace pleural effusions. Atelectasis at both bases. Mild bronchial wall thickening at the lower lobes.  Abdominal sonogram:  IMPRESSION: 1. Coarse hepatic echotexture with small amount of perihepatic ascites. This may indicate chronic liver disease, including hepatic cirrhosis. 2. Hepatopetal blood flow demonstrated within the portal vein. Small caliber nearby vessels may be a sign of cavernous transformation. Correlation with abdominal MRI with and without contrast might be helpful. 3. Gallbladder sludge with large calculus and wall thickening. Negative sonographic Murphy sign.  Surgical consult: Dr. GJohney Mainewith general surgery.  No acute indication for operative intervention at this time     Past Medical History:  Diagnosis Date  . Diabetes mellitus without complication ([place]   . Hypertension     History reviewed. No pertinent surgical history.  Prior to Admission medications   Medication Sig Start Date End Date Taking? Authorizing Provider  calcium carbonate (TUMS - DOSED IN MG ELEMENTAL CALCIUM) 500 MG chewable tablet Chew 1 tablet by mouth at bedtime.   Yes [provider]  donepezil (ARICEPT) 10 MG tablet Take 10 mg by mouth at bedtime.   Yes [provider]  DULoxetine (CYMBALTA) 20 MG capsule Take 20 mg by mouth at bedtime.   Yes [provider]  mirtazapine (REMERON) 15 MG tablet Take 15 mg by mouth at bedtime.   Yes [provider]  omeprazole (PRILOSEC) 20 MG capsule Take 20 mg by mouth at bedtime.   Yes [provider]  oxybutynin (DITROPAN) 5 MG tablet Take 5 mg by mouth at bedtime.   Yes [provider]    Current Facility-Administered Medications  Medication Dose Route Frequency Provider Last Rate Last Dose  . 0.9 %  sodium chloride infusion   Intravenous Continuous Toy Baker, MD 75 mL/hr at 07/18/18 0052    . HYDROmorphone (DILAUDID) injection 1 mg  1 mg Intravenous  Q3H PRN Kirby-Graham, Karsten Fells, NP      . insulin aspart (novoLOG) injection 0-9 Units  0-9 Units Subcutaneous Q4H Toy Baker, MD   2 Units at 07/18/18 0126  . iopamidol (ISOVUE-300) 61 % injection           . LORazepam (ATIVAN) injection 0.5 mg  0.5 mg Intravenous Once Carmin Muskrat, MD      . oxyCODONE (Oxy IR/ROXICODONE) immediate release tablet 5 mg  5 mg Oral Q4H PRN Gardiner Barefoot, NP      . pantoprazole (PROTONIX) injection 40 mg  40 mg Intravenous Q12H Toy Baker, MD   40 mg at 07/17/18 2312  . piperacillin-tazobactam (ZOSYN) IVPB 3.375 g  3.375 g Intravenous Q8H Doutova, Anastassia, MD 12.5 mL/hr at 07/18/18 0620 3.375 g at 07/18/18 0620  . sodium chloride (PF) 0.9 % injection            Current Outpatient Medications  Medication Sig Dispense Refill  . calcium carbonate (TUMS - DOSED IN MG ELEMENTAL CALCIUM) 500 MG chewable tablet Chew 1 tablet by mouth at bedtime.    . donepezil (ARICEPT) 10 MG tablet Take 10 mg by mouth at bedtime.    . DULoxetine (CYMBALTA) 20 MG capsule Take 20 mg by mouth at bedtime.    . mirtazapine (REMERON) 15 MG tablet Take 15 mg by mouth at bedtime.    Marland Kitchen omeprazole (PRILOSEC) 20 MG capsule Take 20 mg by mouth at bedtime.    Marland Kitchen oxybutynin (DITROPAN) 5 MG tablet Take 5 mg by mouth at bedtime.      Allergies as of 07/17/2018 - Review Complete 07/17/2018  Allergen Reaction Noted  . Penicillins Nausea And Vomiting 07/17/2018  . Prednisone  07/17/2018    Family History  Problem Relation Age of Onset  . Diabetes Mother   . Hypertension Father   . Breast cancer Other   . Diabetes Other   . Stroke Neg Hx   . CAD Neg Hx   . Liver disease Neg Hx     Social History   Socioeconomic History  . Marital status: Widowed    Spouse name: Not on file  . Number of children: Not on file  . Years of education: Not on file  . Highest education level: Not on file  Occupational History  . Not on file  Social Needs  . Financial  resource strain: Not on file  . Food insecurity:    Worry: Not on file    Inability: Not on file  . Transportation needs:    Medical: Not on file    Non-medical: Not on file  Tobacco Use  . Smoking status: Never Smoker  . Smokeless tobacco: Never Used  Substance and Sexual Activity  . Alcohol use: Never    Frequency: Never  . Drug use: Never  . Sexual activity: Not on file  Lifestyle  . Physical activity:    Days per week: Not on file    Minutes per session: Not on file  . Stress: Not on file  Relationships  . Social connections:    Talks on phone: Not on file    Gets together: Not on file    Attends religious service: Not on file  Active member of club or organization: Not on file    Attends meetings of clubs or organizations: Not on file    Relationship status: Not on file  . Intimate partner violence:    Fear of current or ex partner: Not on file    Emotionally abused: Not on file    Physically abused: Not on file    Forced sexual activity: Not on file  Other Topics Concern  . Not on file  Social History Narrative  . Not on file    Review of Systems: not obtained as patient has dementia, currently sedated after receiving Ativan, no family at bed side.  Physical Exam: Vital signs in last 24 hours: Temp:  [98.4 F (36.9 C)] 98.4 F (36.9 C) (01/29 1230) Pulse Rate:  [91-119] 103 (01/30 0600) Resp:  [17-29] 25 (01/30 0600) BP: (106-149)/(58-92) 124/63 (01/30 0600) SpO2:  [93 %-96 %] 94 % (01/30 0600) Weight:  [81.6 kg] 81.6 kg (01/29 1225)   General: resting quietly, in NAD  Eyes: icteric sclera  Lungs: clear throughout Heart:  S1, S2, no murmurs Abdomen: soft, obese, mild generalized tenderness to palpation, + BS x 4 quads. Extremities: no edema  Neurologic: alert to name,    Intake/Output from previous day: No intake/output data recorded. Intake/Output this shift: No intake/output data recorded.  Lab Results: Recent Labs    07/17/18 1247  07/18/18 0520  WBC 19.5* 21.1*  HGB 14.9 13.8  HCT 46.7* 42.9  PLT 420* 380   BMET Recent Labs    07/17/18 1247 07/17/18 2123  NA 125* 121*  K 3.8 4.9  CL 91* 87*  CO2 18* 20*  GLUCOSE 182* 121*  BUN 12 18  CREATININE 1.06* 1.19*  CALCIUM 9.0 8.6*   LFT Recent Labs    07/17/18 1247  PROT 6.6  ALBUMIN 3.2*  AST 350*  ALT 308*  ALKPHOS 372*  BILITOT 8.1*   PT/INR Recent Labs    07/17/18 2123  LABPROT 15.6*  INR 1.25   Hepatitis Panel : pending  Studies/Results: Dg Chest 2 View  Result Date: 07/17/2018 CLINICAL DATA:  Initial evaluation for acute shortness of breath. EXAM: CHEST - 2 VIEW COMPARISON:  None. FINDINGS: Cardiac and mediastinal silhouettes within normal limits. Lungs hypoinflated. Streaky bibasilar opacities, most consistent with atelectasis. No consolidative opacity. No edema or pleural effusion. No pneumothorax. No acute osseous finding. Degenerative changes noted about the shoulders bilaterally. IMPRESSION: 1. Shallow lung inflation with streaky bibasilar atelectatic changes. 2. No other active cardiopulmonary disease. Electronically Signed   By: Jeannine Boga M.D.   On: 07/17/2018 13:25   US Abdomen Complete  Result Date: 07/17/2018 CLINICAL DATA:  Abdominal pain EXAM: ABDOMEN ULTRASOUND COMPLETE COMPARISON:  None. FINDINGS: Gallbladder: Gallbladder sludge with large calculus measuring 5 cm. No pericholecystic fluid. There is wall thickening measuring up to 10 mm fall on the hepatic margin. A negative sonographic Percell Miller sign was reported by the sonographer. Common bile duct: Diameter: 4 mm Liver: Coarse hepatic echotexture. Small volume perihepatic ascites adjacent to the left hepatic lobe. There is hepatopetal blood flow within the portal vein. Small caliber adjacent vessels may indicate cavernous transformation. IVC: No abnormality visualized. Pancreas: Poorly visualized due to body habitus. Spleen: Size and appearance within normal limits. Right  Kidney: Length: 11.9 cm. Echogenicity within normal limits. No mass or hydronephrosis visualized. Left Kidney: Length: 12.3 cm. Echogenicity within normal limits. No mass or hydronephrosis visualized. There is a parapelvic cyst measuring 2.6 x 2.3 x 2.0 cm  Abdominal aorta: Poorly visualized due to bowel gas. No visible aneurysm. Other findings: None. IMPRESSION: 1. Coarse hepatic echotexture with small amount of perihepatic ascites. This may indicate chronic liver disease, including hepatic cirrhosis. 2. Hepatopetal blood flow demonstrated within the portal vein. Small caliber nearby vessels may be a sign of cavernous transformation. Correlation with abdominal MRI with and without contrast might be helpful. 3. Gallbladder sludge with large calculus and wall thickening. Negative sonographic Murphy sign. Electronically Signed   By: Ulyses Jarred M.D.   On: 07/17/2018 17:48   Ct Abdomen Pelvis W Contrast  Result Date: 07/17/2018 CLINICAL DATA:  Abdomen pain EXAM: CT ABDOMEN AND PELVIS WITH CONTRAST TECHNIQUE: Multidetector CT imaging of the abdomen and pelvis was performed using the standard protocol following bolus administration of intravenous contrast. CONTRAST:  147m ISOVUE-300 IOPAMIDOL (ISOVUE-300) INJECTION 61% COMPARISON:  Ultrasound 07/17/2018 FINDINGS: Lower chest: Lung bases demonstrate trace pleural effusions. Patchy atelectasis within the bilateral lung bases. Mild bronchial wall thickening. Borderline to mild cardiomegaly with trace pericardial effusion. Hepatobiliary: No focal hepatic abnormality. Fluid and edema along the falciform ligament. Distended gallbladder with faint stone. Wall thickening and surrounding pericholecystic fluid and inflammatory change. Pancreas: No ductal dilatation. Spleen: Normal in size without focal abnormality. Adrenals/Urinary Tract: Adrenal glands are normal. No hydronephrosis. Cyst in the mid to lower left kidney. The bladder is normal. Stomach/Bowel: Very narrowed  appearing GE junction. Large volume of fluid surrounding the GE junction and along the lesser curvature of the stomach. Mild antral wall thickening. No dilated small bowel. Diverticular disease of the Martinez without acute inflammatory change. Vascular/Lymphatic: Nonaneurysmal aorta.  No significant adenopathy. Reproductive: Uterus and bilateral adnexa are unremarkable. Other: Negative for free air.  Free fluid within the upper abdomen. Musculoskeletal: Degenerative changes without acute or suspicious abnormality. IMPRESSION: 1. Unusual constellation of findings. Moderate to large amount of fluid and edema at the GE junction and along the lesser curvature of the stomach; GE junction appears narrowed and wisp like. Findings could be secondary to micro perforation at the GE junction vs peptic ulcer disease, however no extraluminal gas is visualized. Endoscopy could be considered. Pancreatitis is also considered but felt less likely given the distribution of fluid and lack of edema/fluid immediately adjacent to the pancreas. Note that there is abnormal appearance of the gallbladder with large stone, gallbladder wall thickening and pericholecystic fluid/edema suggesting possible cholecystitis. It would be unusual for gallbladder inflammatory change to account for the moderate to large volume of fluid near the stomach 2. Sigmoid Martinez diverticular disease without acute inflammatory change. 3. Trace pleural effusions. Atelectasis at both bases. Mild bronchial wall thickening at the lower lobes. Electronically Signed   By: KDonavan FoilM.D.   On: 07/17/2018 20:54    IMPRESSION/PLAN  * RUQ abdominal pain:  Abd/Pelvic CT 07/18/2018 showed a moderate amount of fluid and edema at the GE junction, ? Microperforation,  the GE junction appears narrowed which requires further imaging prior to invasive endoscopic evaluation.  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   BUN 13. Cr. 1.69 -hospitalist to follow      Noralyn Pick  07/18/2018, 7:43 AM

## 2018-07-19 ENCOUNTER — Encounter (HOSPITAL_COMMUNITY): Payer: Self-pay | Admitting: Emergency Medicine

## 2018-07-19 ENCOUNTER — Inpatient Hospital Stay (HOSPITAL_COMMUNITY): Payer: Medicare Other | Admitting: Anesthesiology

## 2018-07-19 ENCOUNTER — Encounter (HOSPITAL_COMMUNITY)
Admission: EM | Disposition: A | Discharge status: Home Health | Payer: Self-pay | Source: Home / Self Care | Attending: Internal Medicine

## 2018-07-19 DIAGNOSIS — K8043 Calculus of bile duct with acute cholecystitis with obstruction: Secondary | ICD-10-CM

## 2018-07-19 HISTORY — PX: CHOLECYSTECTOMY: SHX55

## 2018-07-19 LAB — BILIRUBIN, FRACTIONATED(TOT/DIR/INDIR)
Bilirubin, Direct: 2.5 mg/dL — ABNORMAL HIGH (ref 0.0–0.2)
Indirect Bilirubin: 1.7 mg/dL — ABNORMAL HIGH (ref 0.3–0.9)
Total Bilirubin: 4.2 mg/dL — ABNORMAL HIGH (ref 0.3–1.2)

## 2018-07-19 LAB — HEPATITIS PANEL, ACUTE
HCV Ab: <0.1 s/co ratio (ref 0.0–0.9)
Hep A IgM: NEGATIVE
Hep B C IgM: NEGATIVE
Hepatitis B Surface Ag: NEGATIVE

## 2018-07-19 LAB — COMPREHENSIVE METABOLIC PANEL
ALT: 114 U/L — ABNORMAL HIGH (ref 0–44)
AST: 60 U/L — ABNORMAL HIGH (ref 15–41)
Albumin: 2.5 g/dL — ABNORMAL LOW (ref 3.5–5.0)
Alkaline Phosphatase: 223 U/L — ABNORMAL HIGH (ref 38–126)
Anion gap: 11 (ref 5–15)
BUN: 22 mg/dL (ref 8–23)
CO2: 20 mmol/L — ABNORMAL LOW (ref 22–32)
Calcium: 8.1 mg/dL — ABNORMAL LOW (ref 8.9–10.3)
Chloride: 103 mmol/L (ref 98–111)
Creatinine, Ser: 1.58 mg/dL — ABNORMAL HIGH (ref 0.44–1.00)
GFR calc Af Amer: 37 mL/min — ABNORMAL LOW (ref >60)
GFR calc non Af Amer: 32 mL/min — ABNORMAL LOW (ref >60)
Glucose, Bld: 119 mg/dL — ABNORMAL HIGH (ref 70–99)
Potassium: 3.4 mmol/L — ABNORMAL LOW (ref 3.5–5.1)
Sodium: 134 mmol/L — ABNORMAL LOW (ref 135–145)
Total Bilirubin: 4.5 mg/dL — ABNORMAL HIGH (ref 0.3–1.2)
Total Protein: 5.8 g/dL — ABNORMAL LOW (ref 6.5–8.1)

## 2018-07-19 LAB — LIPASE, BLOOD: Lipase: 32 U/L (ref 11–51)

## 2018-07-19 LAB — CBC WITH DIFFERENTIAL/PLATELET
Abs Immature Granulocytes: 0.16 10*3/uL — ABNORMAL HIGH (ref 0.00–0.07)
Basophils Absolute: 0 10*3/uL (ref 0.0–0.1)
Basophils Relative: 0 %
Eosinophils Absolute: 0.2 10*3/uL (ref 0.0–0.5)
Eosinophils Relative: 1 %
HCT: 37.7 % (ref 36.0–46.0)
Hemoglobin: 11.8 g/dL — ABNORMAL LOW (ref 12.0–15.0)
Immature Granulocytes: 1 %
Lymphocytes Relative: 6 %
Lymphs Abs: 1.2 10*3/uL (ref 0.7–4.0)
MCH: 27.9 pg (ref 26.0–34.0)
MCHC: 31.3 g/dL (ref 30.0–36.0)
MCV: 89.1 fL (ref 80.0–100.0)
Monocytes Absolute: 1.3 10*3/uL — ABNORMAL HIGH (ref 0.1–1.0)
Monocytes Relative: 7 %
Neutro Abs: 15.6 10*3/uL — ABNORMAL HIGH (ref 1.7–7.7)
Neutrophils Relative %: 85 %
Platelets: 320 10*3/uL (ref 150–400)
RBC: 4.23 MIL/uL (ref 3.87–5.11)
RDW: 15.6 % — ABNORMAL HIGH (ref 11.5–15.5)
WBC: 18.4 10*3/uL — ABNORMAL HIGH (ref 4.0–10.5)
nRBC: 0 % (ref 0.0–0.2)

## 2018-07-19 LAB — SURGICAL PCR SCREEN
MRSA, PCR: NEGATIVE
Staphylococcus aureus: NEGATIVE

## 2018-07-19 LAB — TYPE AND SCREEN
ABO/RH(D): A POS
Antibody Screen: NEGATIVE

## 2018-07-19 LAB — GLUCOSE, CAPILLARY
Glucose-Capillary: 104 mg/dL — ABNORMAL HIGH (ref 70–99)
Glucose-Capillary: 106 mg/dL — ABNORMAL HIGH (ref 70–99)
Glucose-Capillary: 109 mg/dL — ABNORMAL HIGH (ref 70–99)
Glucose-Capillary: 109 mg/dL — ABNORMAL HIGH (ref 70–99)
Glucose-Capillary: 120 mg/dL — ABNORMAL HIGH (ref 70–99)
Glucose-Capillary: 159 mg/dL — ABNORMAL HIGH (ref 70–99)
Glucose-Capillary: 97 mg/dL (ref 70–99)

## 2018-07-19 LAB — PROTIME-INR
INR: 1.2
Prothrombin Time: 15.1 seconds (ref 11.4–15.2)

## 2018-07-19 LAB — ABO/RH: ABO/RH(D): A POS

## 2018-07-19 SURGERY — LAPAROSCOPIC CHOLECYSTECTOMY WITH INTRAOPERATIVE CHOLANGIOGRAM
Anesthesia: General

## 2018-07-19 MED ORDER — ONDANSETRON HCL 4 MG/2ML IJ SOLN
INTRAMUSCULAR | Status: AC
  Filled 2018-07-19: qty 2

## 2018-07-19 MED ORDER — FENTANYL CITRATE (PF) 250 MCG/5ML IJ SOLN
INTRAMUSCULAR | Status: DC | PRN
  Administered 2018-07-19 (×2): 50 ug via INTRAVENOUS

## 2018-07-19 MED ORDER — IPRATROPIUM-ALBUTEROL 0.5-2.5 (3) MG/3ML IN SOLN
3.0000 mL | Freq: Four times a day (QID) | RESPIRATORY_TRACT | Status: DC | PRN
  Administered 2018-07-20 – 2018-07-24 (×4): 3 mL via RESPIRATORY_TRACT
  Filled 2018-07-19 (×5): qty 3

## 2018-07-19 MED ORDER — DEXAMETHASONE SODIUM PHOSPHATE 10 MG/ML IJ SOLN
INTRAMUSCULAR | Status: AC
  Filled 2018-07-19: qty 1

## 2018-07-19 MED ORDER — IPRATROPIUM-ALBUTEROL 0.5-2.5 (3) MG/3ML IN SOLN
3.0000 mL | Freq: Two times a day (BID) | RESPIRATORY_TRACT | Status: DC
  Administered 2018-07-19 – 2018-07-21 (×5): 3 mL via RESPIRATORY_TRACT
  Filled 2018-07-19 (×5): qty 3

## 2018-07-19 MED ORDER — FENTANYL CITRATE (PF) 100 MCG/2ML IJ SOLN
25.0000 ug | INTRAMUSCULAR | Status: DC | PRN
  Administered 2018-07-19 (×2): 50 ug via INTRAVENOUS

## 2018-07-19 MED ORDER — FENTANYL CITRATE (PF) 100 MCG/2ML IJ SOLN
INTRAMUSCULAR | Status: AC
  Filled 2018-07-19: qty 2

## 2018-07-19 MED ORDER — LIDOCAINE 2% (20 MG/ML) 5 ML SYRINGE
INTRAMUSCULAR | Status: AC
  Filled 2018-07-19: qty 5

## 2018-07-19 MED ORDER — ESMOLOL HCL 100 MG/10ML IV SOLN
INTRAVENOUS | Status: DC | PRN
  Administered 2018-07-19: 20 mg via INTRAVENOUS
  Administered 2018-07-19: 10 mg via INTRAVENOUS
  Administered 2018-07-19: 20 mg via INTRAVENOUS
  Administered 2018-07-19 (×3): 10 mg via INTRAVENOUS

## 2018-07-19 MED ORDER — HYDROMORPHONE HCL 1 MG/ML IJ SOLN
0.5000 mg | INTRAMUSCULAR | Status: DC | PRN
  Administered 2018-07-20 (×2): 1 mg via INTRAVENOUS
  Administered 2018-07-20: 0.5 mg via INTRAVENOUS
  Administered 2018-07-21 – 2018-07-22 (×4): 1 mg via INTRAVENOUS
  Filled 2018-07-19 (×7): qty 1

## 2018-07-19 MED ORDER — LIDOCAINE 2% (20 MG/ML) 5 ML SYRINGE
INTRAMUSCULAR | Status: DC | PRN
  Administered 2018-07-19: 50 mg via INTRAVENOUS

## 2018-07-19 MED ORDER — SUGAMMADEX SODIUM 200 MG/2ML IV SOLN
INTRAVENOUS | Status: AC
  Filled 2018-07-19: qty 2

## 2018-07-19 MED ORDER — ROCURONIUM BROMIDE 100 MG/10ML IV SOLN
INTRAVENOUS | Status: AC
  Filled 2018-07-19: qty 2

## 2018-07-19 MED ORDER — ESMOLOL HCL 100 MG/10ML IV SOLN
INTRAVENOUS | Status: AC
  Filled 2018-07-19: qty 10

## 2018-07-19 MED ORDER — PROPOFOL 10 MG/ML IV BOLUS
INTRAVENOUS | Status: AC
  Filled 2018-07-19: qty 20

## 2018-07-19 MED ORDER — PROPOFOL 10 MG/ML IV BOLUS
INTRAVENOUS | Status: DC | PRN
  Administered 2018-07-19: 80 mg via INTRAVENOUS

## 2018-07-19 MED ORDER — DEXAMETHASONE SODIUM PHOSPHATE 10 MG/ML IJ SOLN
INTRAMUSCULAR | Status: DC | PRN
  Administered 2018-07-19: 4 mg via INTRAVENOUS

## 2018-07-19 MED ORDER — SUGAMMADEX SODIUM 500 MG/5ML IV SOLN
INTRAVENOUS | Status: AC
  Filled 2018-07-19: qty 5

## 2018-07-19 MED ORDER — SUCCINYLCHOLINE CHLORIDE 200 MG/10ML IV SOSY
PREFILLED_SYRINGE | INTRAVENOUS | Status: AC
  Filled 2018-07-19: qty 10

## 2018-07-19 MED ORDER — EPHEDRINE 5 MG/ML INJ
INTRAVENOUS | Status: AC
  Filled 2018-07-19: qty 10

## 2018-07-19 MED ORDER — MUPIROCIN 2 % EX OINT: 1.0000 "application " | TOPICAL_OINTMENT | Freq: Two times a day (BID) | CUTANEOUS | Status: DC

## 2018-07-19 MED ORDER — IPRATROPIUM-ALBUTEROL 0.5-2.5 (3) MG/3ML IN SOLN
3.0000 mL | Freq: Four times a day (QID) | RESPIRATORY_TRACT | Status: DC
  Administered 2018-07-19: 3 mL via RESPIRATORY_TRACT
  Filled 2018-07-19: qty 3

## 2018-07-19 MED ORDER — ACETAMINOPHEN 500 MG PO TABS
1000.0000 mg | ORAL_TABLET | Freq: Once | ORAL | Status: AC
  Administered 2018-07-19: 1000 mg via ORAL
  Filled 2018-07-19: qty 2

## 2018-07-19 MED ORDER — IOPAMIDOL (ISOVUE-300) INJECTION 61%
INTRAVENOUS | Status: AC
  Filled 2018-07-19: qty 50

## 2018-07-19 MED ORDER — LACTATED RINGERS IV SOLN
INTRAVENOUS | Status: DC | PRN
  Administered 2018-07-19 (×2): via INTRAVENOUS

## 2018-07-19 MED ORDER — LACTATED RINGERS IV SOLN
INTRAVENOUS | Status: DC
  Administered 2018-07-19: 13:00:00 via INTRAVENOUS

## 2018-07-19 MED ORDER — SUGAMMADEX SODIUM 200 MG/2ML IV SOLN
INTRAVENOUS | Status: DC | PRN
  Administered 2018-07-19: 300 mg via INTRAVENOUS

## 2018-07-19 MED ORDER — ROCURONIUM BROMIDE 10 MG/ML (PF) SYRINGE
PREFILLED_SYRINGE | INTRAVENOUS | Status: DC | PRN
  Administered 2018-07-19: 10 mg via INTRAVENOUS
  Administered 2018-07-19: 40 mg via INTRAVENOUS
  Administered 2018-07-19 (×2): 10 mg via INTRAVENOUS

## 2018-07-19 MED ORDER — IPRATROPIUM-ALBUTEROL 0.5-2.5 (3) MG/3ML IN SOLN: 3.0000 mL | Freq: Four times a day (QID) | RESPIRATORY_TRACT | Status: DC

## 2018-07-19 MED ORDER — BUPIVACAINE LIPOSOME 1.3 % IJ SUSP
20.0000 mL | Freq: Once | INTRAMUSCULAR | Status: AC
  Administered 2018-07-19: 20 mL
  Filled 2018-07-19: qty 20

## 2018-07-19 MED ORDER — SUCCINYLCHOLINE CHLORIDE 200 MG/10ML IV SOSY
PREFILLED_SYRINGE | INTRAVENOUS | Status: DC | PRN
  Administered 2018-07-19: 120 mg via INTRAVENOUS

## 2018-07-19 MED ORDER — LACTATED RINGERS IR SOLN
Status: DC | PRN
  Administered 2018-07-19: 1000 mL

## 2018-07-19 SURGICAL SUPPLY — 47 items
APPLIER CLIP ROT 10 11.4 M/L (STAPLE) ×6
CABLE HIGH FREQUENCY MONO STRZ (ELECTRODE) IMPLANT
CHLORAPREP W/TINT 26ML (MISCELLANEOUS) ×3 IMPLANT
CLIP APPLIE ROT 10 11.4 M/L (STAPLE) ×2 IMPLANT
CLIP VESOLOCK MED LG 6/CT (CLIP) IMPLANT
COVER MAYO STAND STRL (DRAPES) ×3 IMPLANT
COVER SURGICAL LIGHT HANDLE (MISCELLANEOUS) ×3 IMPLANT
COVER WAND RF STERILE (DRAPES) IMPLANT
DECANTER SPIKE VIAL GLASS SM (MISCELLANEOUS) ×3 IMPLANT
DERMABOND ADVANCED (GAUZE/BANDAGES/DRESSINGS) ×2
DERMABOND ADVANCED .7 DNX12 (GAUZE/BANDAGES/DRESSINGS) ×1 IMPLANT
DRAIN CHANNEL 19F RND (DRAIN) ×3 IMPLANT
DRAPE C-ARM 42X120 X-RAY (DRAPES) ×3 IMPLANT
ELECT L-HOOK LAP 45CM DISP (ELECTROSURGICAL)
ELECT PENCIL ROCKER SW 15FT (MISCELLANEOUS) ×3 IMPLANT
ELECT REM PT RETURN 15FT ADLT (MISCELLANEOUS) ×3 IMPLANT
ELECTRODE L-HOOK LAP 45CM DISP (ELECTROSURGICAL) IMPLANT
ENDOLOOP SUT PDS II  0 18 (SUTURE) ×4
ENDOLOOP SUT PDS II 0 18 (SUTURE) ×2 IMPLANT
EVACUATOR SILICONE 100CC (DRAIN) ×3 IMPLANT
GLOVE BIO SURGEON STRL SZ 6 (GLOVE) ×30 IMPLANT
GLOVE INDICATOR 6.5 STRL GRN (GLOVE) ×3 IMPLANT
GOWN STRL REUS W/TWL 2XL LVL3 (GOWN DISPOSABLE) ×3 IMPLANT
GOWN STRL REUS W/TWL XL LVL3 (GOWN DISPOSABLE) ×12 IMPLANT
HEMOSTAT SNOW SURGICEL 2X4 (HEMOSTASIS) ×6 IMPLANT
KIT BASIN OR (CUSTOM PROCEDURE TRAY) ×3 IMPLANT
L-HOOK LAP DISP 36CM (ELECTROSURGICAL) ×3
LHOOK LAP DISP 36CM (ELECTROSURGICAL) ×1 IMPLANT
POUCH RETRIEVAL ECOSAC 10 (ENDOMECHANICALS) ×1 IMPLANT
POUCH RETRIEVAL ECOSAC 10MM (ENDOMECHANICALS) ×2
POUCH SPECIMEN RETRIEVAL 10MM (ENDOMECHANICALS) ×3 IMPLANT
PROTECTOR NERVE ULNAR (MISCELLANEOUS) IMPLANT
SCISSORS LAP 5X35 DISP (ENDOMECHANICALS) ×3 IMPLANT
SET CHOLANGIOGRAPH MIX (MISCELLANEOUS) ×3 IMPLANT
SET IRRIG TUBING LAPAROSCOPIC (IRRIGATION / IRRIGATOR) ×6 IMPLANT
SET TUBE SMOKE EVAC HIGH FLOW (TUBING) ×3 IMPLANT
SLEEVE XCEL OPT CAN 5 100 (ENDOMECHANICALS) ×6 IMPLANT
SUT ETHILON 2 0 PS N (SUTURE) ×3 IMPLANT
SUT MNCRL AB 4-0 PS2 18 (SUTURE) ×3 IMPLANT
SUT VICRYL 0 UR6 27IN ABS (SUTURE) ×3 IMPLANT
TAPE CLOTH 4X10 WHT NS (GAUZE/BANDAGES/DRESSINGS) IMPLANT
TOWEL OR 17X26 10 PK STRL BLUE (TOWEL DISPOSABLE) ×3 IMPLANT
TOWEL OR NON WOVEN STRL DISP B (DISPOSABLE) ×3 IMPLANT
TRAY LAPAROSCOPIC (CUSTOM PROCEDURE TRAY) ×3 IMPLANT
TROCAR BLADELESS OPT 5 100 (ENDOMECHANICALS) ×3 IMPLANT
TROCAR XCEL BLUNT TIP 100MML (ENDOMECHANICALS) ×3 IMPLANT
TROCAR XCEL NON-BLD 11X100MML (ENDOMECHANICALS) ×3 IMPLANT

## 2018-07-19 NOTE — Transfer of Care (Signed)
Immediate Anesthesia Transfer of Care Note  Patient: Kristen Martinez  Procedure(s) Performed: Procedure(s): LAPAROSCOPIC CHOLECYSTECTOMY (N/A)  Patient Location: PACU  Anesthesia Type:General  Level of Consciousness:  sedated, patient cooperative and responds to stimulation  Airway & Oxygen Therapy:Patient Spontanous Breathing and Patient connected to face mask oxgen  Post-op Assessment:  Report given to PACU RN and Post -op Vital signs reviewed and stable  Post vital signs:  Reviewed and stable  Last Vitals:  Vitals:   07/19/18 1612 07/19/18 1615  BP: (!) 151/73 (!) 152/71  Pulse: 78 74  Resp: (!) 24 (!) 21  Temp: 37.4 C   SpO2: 98% 100%    Complications: No apparent anesthesia complications

## 2018-07-19 NOTE — Op Note (Addendum)
Laparoscopic Cholecystectomy  Indications: This patient presents with biliary pancreatitis and will undergo laparoscopic cholecystectomy.  Pre-operative Diagnosis: biliary pancreatitis  Post-operative Diagnosis: Same  Surgeon: Almond LintBYERLY,Arul Farabee   Assistants: Estelle GrumblesSteve Gross, MD Mattie MarlinJessica Focht, PA-C  Anesthesia: General endotracheal anesthesia and local  ASA Class: 2  Procedure Details  The patient was seen again in the Holding Room. The risks, benefits, complications, treatment options, and expected outcomes were discussed with the patient. The possibilities of  bleeding, recurrent infection, damage to nearby structures, the need for additional procedures, failure to diagnose a condition, the possible need to convert to an open procedure, and creating a complication requiring transfusion or operation were discussed with the patient. The likelihood of improving the patient's symptoms with return to their baseline status is good.    The patient and/or family concurred with the proposed plan, giving informed consent. The site of surgery properly noted. The patient was taken to Operating Room, and the procedure verified as Laparoscopic Cholecystectomy with Possible Intraoperative Cholangiogram. A Time Out was held and the above information confirmed.  Prior to the induction of general anesthesia, antibiotic prophylaxis was administered. General endotracheal anesthesia was then administered and tolerated well. After the induction, the abdomen was prepped with Chloraprep and draped in the sterile fashion. The patient was positioned in the supine position.  Local anesthetic agent was injected into the skin near the umbilicus and an incision made. We dissected down to the abdominal fascia with blunt dissection.  The fascia was incised vertically and we entered the peritoneal cavity bluntly.  A pursestring suture of 0-Vicryl was placed around the fascial opening.  The Hasson cannula was inserted and secured  with the stay suture.  Pneumoperitoneum was then created with CO2 and tolerated well without any adverse changes in the patient's vital signs. An 11-mm port was placed in the subxiphoid position.  Two 5-mm ports were placed in the right upper quadrant. All skin incisions were infiltrated with a local anesthetic agent before making the incision and placing the trocars.    We positioned the patient in reverse Trendelenburg, tilted slightly to the patient's left.  The gallbladder was identified, the fundus grasped and retracted cephalad. Adhesions were lysed bluntly and with the electrocautery where indicated, taking care not to injure any adjacent organs or viscus. A fifth trocar was placed in the the upper midline in order to assist with retraction.  The 5 cm stone was in the lower gallbladder, making it difficult to retract the infundibulum laterally.  The gallbladder was then taken dome down.  There was a tongue of liver that fell over the gallbladder that was bleeding.  The stone was then able to be pushed up higher in the gallbladder.  The infundibulum was grasped and retracted laterally, exposing the peritoneum overlying the triangle of Calot.  Because of body habitus, this was extremely difficult.  Dr. Michaell CowingGross came in to assist.  The triangle of calot was exposed.  was then divided and exposed in a blunt fashion. A critical view of the cystic duct and cystic artery was obtained.  The cystic artery was clipped twice proximally and once distally.  The cystic duct was then clipped distally, twice proximally, and then looped twice.    The gallbladder was removed and placed in an Ecosac.  The gallbladder and sac were then removed through the umbilical port site.  The liver bed was irrigated and inspected. Hemostasis was achieved with the electrocautery. Copious irrigation was utilized and was repeatedly aspirated until clear.  Because of the friability of the liver, snow hemostatic agent was placed in the  gallbladder fossa and on the tongue of liver.    We again inspected the right upper quadrant for hemostasis.  Pneumoperitoneum was released as we removed the trocars.   The pursestring suture was used to close the umbilical fascia, but there was still a fascial defect.  Two additional 0-0 vicryls were used to close the fascia.  4-0 Monocryl was used to close the skin.   The skin was cleaned and dried, and soft dressings were applied. The patient was then extubated and brought to the recovery room in stable condition. Instrument, sponge, and needle counts were correct at closure and at the conclusion of the case.   Findings: Inflamed gallbladder, no significant ascites.  Fatty liver, friable.  Very large stone.      Estimated Blood Loss: 100 mL         Drains: 19 Fr blake drain          Specimens: Gallbladder to pathology       Complications: None; patient tolerated the procedure well.         Disposition: PACU - hemodynamically stable.         Condition: stable

## 2018-07-19 NOTE — Progress Notes (Signed)
[facility] Progress Note  CC:  Cholecystitis, Cholelithiasis  Subjective: She is awake and more talkative this am. No abdominal pain. She complains of pressure to her vaginal area. She complains of feeling SOB. Nurse reports she passed 3 brown BMs yesterday. No BM this am.    Objective:  Vital signs in last 24 hours: Temp:  [98 F (36.7 C)-99.2 F (37.3 C)] 98.5 F (36.9 C) (01/31 0333) Pulse Rate:  [88-115] 103 (01/31 0638) Resp:  [16-20] 20 (01/31 3833) BP: (109-140)/(67-108) 140/75 (01/31 3832) SpO2:  [92 %-100 %] 93 % (01/31 9191) Last BM Date: 07/17/18 General:  Obese female in NAD Heart: S1, S2, no murmurs Pulm: clear throughout.  Abdomen: obese abdomen, soft, epigastric and central lower abdominal tenderness without rebound or guarding, central lower abdominal scar. Extremities: No edema Neurol: Alert to name and date, stated she was in the hospital    Intake/Output from previous day: 01/30 0701 - 01/31 0700 In: 1923.3 [I.V.:1696; IV Piggyback:227.3] Out: -  Intake/Output this shift: No intake/output data recorded.  Lab Results: Recent Labs    07/17/18 1247 07/18/18 0520 07/18/18 1505 07/19/18 0608  WBC 19.5* 21.1*  --  18.4*  HGB 14.9 13.8 13.4 11.8*  HCT 46.7* 42.9 41.2 37.7  PLT 420* 380  --  320   BMET Recent Labs    07/17/18 2123 07/18/18 0520 07/19/18 0608  NA 121* 126* 134*  K 4.9 4.1 3.4*  CL 87* 92* 103  CO2 20* 17* 20*  GLUCOSE 121* 110* 119*  BUN _0 CREATININE 1.19* 1.69* 1.58*  CALCIUM 8.6* 8.7* 8.1*   LFT Recent Labs    07/19/18 0608  PROT 5.8*  ALBUMIN 2.5*  AST 60*  ALT 114*  ALKPHOS 223*  BILITOT 4.5*   PT/INR Recent Labs    07/17/18 2123 07/19/18 0608  LABPROT 15.6* 15.1  INR 1.25 1.20   Acute Hepatitis Panel: pending    Dg Chest 2 View  Result Date: 07/17/2018 CLINICAL DATA:  Initial evaluation for acute shortness of breath. EXAM: CHEST - 2 VIEW COMPARISON:  None. FINDINGS:  Cardiac and mediastinal silhouettes within normal limits. Lungs hypoinflated. Streaky bibasilar opacities, most consistent with atelectasis. No consolidative opacity. No edema or pleural effusion. No pneumothorax. No acute osseous finding. Degenerative changes noted about the shoulders bilaterally. IMPRESSION: 1. Shallow lung inflation with streaky bibasilar atelectatic changes. 2. No other active cardiopulmonary disease. Electronically Signed   By: Jeannine Boga M.D.   On: 07/17/2018 13:25   US Abdomen Complete  Result Date: 07/17/2018 CLINICAL DATA:  Abdominal pain EXAM: ABDOMEN ULTRASOUND COMPLETE COMPARISON:  None. FINDINGS: Gallbladder: Gallbladder sludge with large calculus measuring 5 cm. No pericholecystic fluid. There is wall thickening measuring up to 10 mm fall on the hepatic margin. A negative sonographic Percell Miller sign was reported by the sonographer. Common bile duct: Diameter: 4 mm Liver: Coarse hepatic echotexture. Small volume perihepatic ascites adjacent to the left hepatic lobe. There is hepatopetal blood flow within the portal vein. Small caliber adjacent vessels may indicate cavernous transformation. IVC: No abnormality visualized. Pancreas: Poorly visualized due to body habitus. Spleen: Size and appearance within normal limits. Right Kidney: Length: 11.9 cm. Echogenicity within normal limits. No mass or hydronephrosis visualized. Left Kidney: Length: 12.3 cm. Echogenicity within normal limits. No mass or hydronephrosis visualized. There is a parapelvic cyst measuring 2.6 x 2.3 x 2.0 cm Abdominal aorta: Poorly visualized due to bowel gas. No visible aneurysm. Other findings: None. IMPRESSION:  1. Coarse hepatic echotexture with small amount of perihepatic ascites. This may indicate chronic liver disease, including hepatic cirrhosis. 2. Hepatopetal blood flow demonstrated within the portal vein. Small caliber nearby vessels may be a sign of cavernous transformation. Correlation with  abdominal MRI with and without contrast might be helpful. 3. Gallbladder sludge with large calculus and wall thickening. Negative sonographic Murphy sign. Electronically Signed   By: Ulyses Jarred M.D.   On: 07/17/2018 17:48   Ct Abdomen Pelvis W Contrast  Result Date: 07/17/2018 CLINICAL DATA:  Abdomen pain EXAM: CT ABDOMEN AND PELVIS WITH CONTRAST TECHNIQUE: Multidetector CT imaging of the abdomen and pelvis was performed using the standard protocol following bolus administration of intravenous contrast. CONTRAST:  181m ISOVUE-300 IOPAMIDOL (ISOVUE-300) INJECTION 61% COMPARISON:  Ultrasound 07/17/2018 FINDINGS: Lower chest: Lung bases demonstrate trace pleural effusions. Patchy atelectasis within the bilateral lung bases. Mild bronchial wall thickening. Borderline to mild cardiomegaly with trace pericardial effusion. Hepatobiliary: No focal hepatic abnormality. Fluid and edema along the falciform ligament. Distended gallbladder with faint stone. Wall thickening and surrounding pericholecystic fluid and inflammatory change. Pancreas: No ductal dilatation. Spleen: Normal in size without focal abnormality. Adrenals/Urinary Tract: Adrenal glands are normal. No hydronephrosis. Cyst in the mid to lower left kidney. The bladder is normal. Stomach/Bowel: Very narrowed appearing GE junction. Large volume of fluid surrounding the GE junction and along the lesser curvature of the stomach. Mild antral wall thickening. No dilated small bowel. 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 Vascular/Lymphatic: Nonaneurysmal aorta.  No significant adenopathy. Reproductive: Uterus and bilateral adnexa are unremarkable. Other: Negative for free air.  Free fluid within the upper abdomen. Musculoskeletal: Degenerative changes without acute or suspicious abnormality. IMPRESSION: 1. Unusual constellation of findings. Moderate to large amount of fluid and edema at the GE junction and along the lesser curvature  of the stomach; GE junction appears narrowed and wisp like. Findings could be secondary to micro perforation at the GE junction vs peptic ulcer disease, however no extraluminal gas is visualized. Endoscopy could be considered. Pancreatitis is also considered but felt less likely given the distribution of fluid and lack of edema/fluid immediately adjacent to the pancreas. Note that there is abnormal appearance of the gallbladder with large stone, gallbladder wall thickening and pericholecystic fluid/edema suggesting possible cholecystitis. It would be unusual for gallbladder inflammatory change to account for the moderate to large volume of fluid near the stomach 2. Sigmoid colon diverticular disease without acute inflammatory change. 3. Trace pleural effusions. Atelectasis at both bases. Mild bronchial wall thickening at the lower lobes. Electronically Signed   By: KDonavan FoilM.D.   On: 07/17/2018 20:54   Mr Abdomen Mrcp Wo Contrast  Result Date: 07/18/2018 CLINICAL DATA:  Cholecystitis, cholelithiasis, fluid along the gastric wall and attic margin at CT, for further characterization. EXAM: MRI ABDOMEN WITHOUT CONTRAST  (INCLUDING MRCP) TECHNIQUE: Multiplanar multisequence MR imaging of the abdomen was performed. Heavily T2-weighted images of the biliary and pancreatic ducts were obtained, and three-dimensional MRCP images were rendered by post processing. COMPARISON:  CT scan dated 07/17/2018 IV contrast could not be administered due to renal insufficiency. FINDINGS: Lower chest: Small bilateral pleural effusions. Mild cardiomegaly. Mild atelectasis in both lung bases. Hepatobiliary: Mild perihepatic ascites. Mild periportal edema carried no focal liver lesion is identified on today's noncontrast MRI. No biliary dilatation is observed. This 5.1 cm in long axis gallstone in the gallbladder along with layering sludge in the gallbladder. Gallbladder wall thickening is present. Pancreas: Trace peripancreatic  stranding is thought to be from extrinsic causes rather than acute pancreatitis, although correlation with lipase levels would be suggested. No dorsal pancreatic duct dilatation. Spleen:  Unremarkable Adrenals/Urinary Tract: Although some of the hemorrhagic or proteinaceous fluid from around the stomach tracks around the lateral limb of the left adrenal gland, I am skeptical that the left adrenal gland is the source of the hemorrhage. There is some tracking of blood products along the top of the left perirenal space and down the retrorenal space. The kidney does not appear to be the epicenter of this process. Suspected cyst of the left mid kidney anteriorly. Stomach/Bowel: There is infiltrative fluid density with high T1 signal especially along the margins of the stomach including the lesser sac margin and inferior margin as well as the posterior margin. This also tracks up partially adjacent to the spleen, left adrenal gland, and around the left perirenal space margins as noted above. The gastroesophageal junction is surrounded by some of this complex fluid, which also tracks down around the falciform ligament and anterior margin of the lateral segment left hepatic lobe. The appearance favors hemorrhage given the high precontrast T1 signal. I do not identify a discrete mass of the distal esophagus or gastric wall or of the adrenal gland 2 account for this apparent acute hemorrhage. I do not see findings of extraluminal gas to suggest a perforated ulcer. There is a considerable degree of colonic diverticulosis but I do not see active diverticulitis in the upper abdomen. Vascular/Lymphatic: No pathologic adenopathy. Vascular contours appear normal. On the recent CT scan I do not observe definite active extravasation of contrast. The portal vein and its tributaries are mildly narrowed but without cavernous transformation. Other: In addition to the complex fluid noted above, there is a small amount of perihepatic  ascites which is not complex. Musculoskeletal: Unremarkable IMPRESSION: 1. Abnormal complex fluid with high precontrast T1 signal favoring acute hemorrhage along the margin of the stomach, gastroesophageal junction, and tracking around the left hepatic lobe and falciform ligament. Some of this process also tracks around the left perirenal space down into the left retro renal space. The origin of the acute hemorrhage is not identified although the epicenter of the process is probably along the proximal gastric wall. Today's exam does not have IV contrast, but the prior CT scan did not appear to show active extravasation of contrast in the amount of hemorrhage appears similar to last night's exam. No extraluminal gas to suggest a perforated ulcer. The exact cause and origin of the hemorrhage is uncertain. 2. No overt nodularity of the liver or definite morphologic findings of cirrhosis. 3. Gallbladder wall thickening with cholelithiasis and sludge-correlate clinically in assessing for acute cholecystitis. 4. Small bilateral pleural effusions with mild cardiomegaly. 5. In addition to the above-noted hemorrhage, there is some mild simple perihepatic ascites which does not include blood products. 6. Considerable colonic diverticulosis. Electronically Signed   By: Van Clines M.D.   On: 07/18/2018 13:39   Mr 3d Recon At Scanner  Result Date: 07/18/2018 CLINICAL DATA:  Cholecystitis, cholelithiasis, fluid along the gastric wall and attic margin at CT, for further characterization. EXAM: MRI ABDOMEN WITHOUT CONTRAST  (INCLUDING MRCP) TECHNIQUE: Multiplanar multisequence MR imaging of the abdomen was performed. Heavily T2-weighted images of the biliary and pancreatic ducts were obtained, and three-dimensional MRCP images were rendered by post processing. COMPARISON:  CT scan dated 07/17/2018 IV contrast could not be administered due to renal insufficiency. FINDINGS: Lower chest: Small bilateral pleural effusions.  Mild cardiomegaly. Mild atelectasis in both lung bases. Hepatobiliary: Mild perihepatic ascites. Mild periportal edema carried no focal liver lesion is identified on today's noncontrast MRI. No biliary dilatation is observed. This 5.1 cm in long axis gallstone in the gallbladder along with layering sludge in the gallbladder. Gallbladder wall thickening is present. Pancreas: Trace peripancreatic stranding is thought to be from extrinsic causes rather than acute pancreatitis, although correlation with lipase levels would be suggested. No dorsal pancreatic duct dilatation. Spleen:  Unremarkable Adrenals/Urinary Tract: Although some of the hemorrhagic or proteinaceous fluid from around the stomach tracks around the lateral limb of the left adrenal gland, I am skeptical that the left adrenal gland is the source of the hemorrhage. There is some tracking of blood products along the top of the left perirenal space and down the retrorenal space. The kidney does not appear to be the epicenter of this process. Suspected cyst of the left mid kidney anteriorly. Stomach/Bowel: There is infiltrative fluid density with high T1 signal especially along the margins of the stomach including the lesser sac margin and inferior margin as well as the posterior margin. This also tracks up partially adjacent to the spleen, left adrenal gland, and around the left perirenal space margins as noted above. The gastroesophageal junction is surrounded by some of this complex fluid, which also tracks down around the falciform ligament and anterior margin of the lateral segment left hepatic lobe. The appearance favors hemorrhage given the high precontrast T1 signal. I do not identify a discrete mass of the distal esophagus or gastric wall or of the adrenal gland 2 account for this apparent acute hemorrhage. I do not see findings of extraluminal gas to suggest a perforated ulcer. There is a considerable degree of colonic diverticulosis but I do not  see active diverticulitis in the upper abdomen. Vascular/Lymphatic: No pathologic adenopathy. Vascular contours appear normal. On the recent CT scan I do not observe definite active extravasation of contrast. The portal vein and its tributaries are mildly narrowed but without cavernous transformation. Other: In addition to the complex fluid noted above, there is a small amount of perihepatic ascites which is not complex. Musculoskeletal: Unremarkable IMPRESSION: 1. Abnormal complex fluid with high precontrast T1 signal favoring acute hemorrhage along the margin of the stomach, gastroesophageal junction, and tracking around the left hepatic lobe and falciform ligament. Some of this process also tracks around the left perirenal space down into the left retro renal space. The origin of the acute hemorrhage is not identified although the epicenter of the process is probably along the proximal gastric wall. Today's exam does not have IV contrast, but the prior CT scan did not appear to show active extravasation of contrast in the amount of hemorrhage appears similar to last night's exam. No extraluminal gas to suggest a perforated ulcer. The exact cause and origin of the hemorrhage is uncertain. 2. No overt nodularity of the liver or definite morphologic findings of cirrhosis. 3. Gallbladder wall thickening with cholelithiasis and sludge-correlate clinically in assessing for acute cholecystitis. 4. Small bilateral pleural effusions with mild cardiomegaly. 5. In addition to the above-noted hemorrhage, there is some mild simple perihepatic ascites which does not include blood products. 6. Considerable colonic diverticulosis. Electronically Signed   By: Van Clines M.D.   On: 07/18/2018 13:39    Assessment / Plan:  * Acute Cholecystitis -continue Zosyn -monitor daily CBC   *Cholelithiasis: large 5.1 cm gallstone in setting of cholecystitis, MRCP without evidence of biliary dilatation or CBD stones,  however,  most likely had CBD stones which passed as T. Bili level decreasing today ( fractionated bili pending).  -eventual cholecystectomy   * Elevated LFTs due to gallstones/cholecystitis, ? CBD duct stones which passed. No evidence of pancreatitis. No evidence of cirrhosis or cavernous transformation per abdominal MRI w/o contrast. LFTS decreasing. Alk phos 223.  T. Bili 4.5 down from 7.9.  AST 60. ALT 114.  Lipase 32. - follow daily hepatic panel -acute hepatic panel pending  * Acute abdominal hemorrhage of unclear etiology. Abdominal MRI/MRCP 07/18/2018 showed an acute hemorrhage along the stomach which tracks partially adjacent to the spleen, down to the left adrenal gland and to the left perirenal space. Noevidence of a gastric perforation. No evidence of a splenic artery aneurysm as discussed with radiologist, Dr. Janeece Fitting.  Hg decreased 13.4 -> 11.8. HCT 41.1->37.7.  * AKI: Bun/Cr increased after A/P CT with IV contrast. Slow IV hydration in setting of hyponatremia. Bun/Cr levels improving this am. BUN 20. Cr. 1.58. Na 134.  * Hyponatremia improving -IV fluids per hospitalist  *Vaginal pressure -hospitalist to follow        LOS: 2 days   Kristen Martinez  07/19/2018, 7:20 AM

## 2018-07-19 NOTE — Progress Notes (Addendum)
  CC: Abdominal pain  Subjective: She is tired and feels weak but overall she probably feels better.  She said her abdomen hurt but when I asked her to located she could not find a place till she came up to the right upper quadrant.  She still has some tenderness there.  Objective: Vital signs in last 24 hours: Temp:  [98 F (36.7 C)-99.8 F (37.7 C)] 99.8 F (37.7 C) (01/31 0846) Pulse Rate:  [88-115] 102 (01/31 0846) Resp:  [16-22] 22 (01/31 0846) BP: (109-140)/(55-108) 127/55 (01/31 0846) SpO2:  [92 %-100 %] 94 % (01/31 0846) Last BM Date: 07/18/18 1923 IV Urine x1 No BM recorded Afebrile vital signs are stable Sodium is up to 134, potassium is 3.4, Creatinine still elevated at 1.58 LFTs improved. Alk phos 372>> 308>> 223 AST 350>> 140>> 60 ALT 308>> 203>> 114 Total bilirubin 8.1>> 7.9>> 4.5 WBC still elevated but improving at 18.4.  Intake/Output from previous day: 01/30 0701 - 01/31 0700 In: 1923.3 [I.V.:1696; IV Piggyback:227.3] Out: -  Intake/Output this shift: No intake/output data recorded.  General appearance: alert, cooperative and no distress Resp: clear to auscultation bilaterally GI: Soft, large abdomen, primary discomfort appears to be in the right upper quadrant this a.m.  Lab Results:  Recent Labs    07/18/18 0520 07/18/18 1505 07/19/18 0608  WBC 21.1*  --  18.4*  HGB 13.8 13.4 11.8*  HCT 42.9 41.2 37.7  PLT 380  --  320    BMET Recent Labs    07/18/18 0520 07/19/18 0608  NA 126* 134*  K 4.1 3.4*  CL 92* 103  CO2 17* 20*  GLUCOSE 110* 119*  BUN 23 22  CREATININE 1.69* 1.58*  CALCIUM 8.7* 8.1*   PT/INR Recent Labs    07/17/18 2123 07/19/18 0608  LABPROT 15.6* 15.1  INR 1.25 1.20    Recent Labs  Lab 07/17/18 1247 07/18/18 0520 07/19/18 0608  AST 350* 140* 60*  ALT 308* 203* 114*  ALKPHOS 372* 308* 223*  BILITOT 8.1* 7.9* 4.2*  4.5*  PROT 6.6 6.4* 5.8*  ALBUMIN 3.2* 3.0* 2.5*     Lipase     Component Value  Date/Time   LIPASE 32 07/19/2018 0608     Medications: . insulin aspart  0-9 Units Subcutaneous Q4H  . ipratropium-albuterol  3 mL Nebulization Q6H  . pantoprazole (PROTONIX) IV  40 mg Intravenous Q12H   . sodium chloride 150 mL/hr at 07/19/18 0506  . piperacillin-tazobactam (ZOSYN)  IV 3.375 g (07/19/18 0505)   Anti-infectives (From admission, onward)   Start     Dose/Rate Route Frequency Ordered Stop   07/18/18 0600  piperacillin-tazobactam (ZOSYN) IVPB 3.375 g     3.375 g 12.5 mL/hr over 240 Minutes Intravenous Every 8 hours 07/17/18 2008     07/17/18 2030  piperacillin-tazobactam (ZOSYN) IVPB 3.375 g     3.375 g 100 mL/hr over 30 Minutes Intravenous  Once 07/17/18 2006 07/17/18 2242   07/17/18 1830  ciprofloxacin (CIPRO) IVPB 400 mg     400 mg 200 mL/hr over 60 Minutes Intravenous  Once 07/17/18 1822 07/17/18 2154      Assessment/Plan Hyperlipidemia + THC Elevated troponin 0.03 x 2, <0.03 x 1 Hyponatremia 125>>121>>126>>134 today Prediabetes - Glucose monitoring Leukocytosis - uncertain etilology AKI - creatinine 1.06>>1.19>>1.69>>1.58 Alk phos 372>> 308>> 223 AST 350>> 140>> 60 ALT 308>> 203>> 114 Total bilirubin 8.1>> 7.9>> 4.5 Lipase 144>>59>>32  Abdominal pain, nausea and vomiting cholelithiasis with sludge Mild pancreatitis -   resolving 144>>59>>32 Acute hemorrhage along the margins of the stomach/GE junction - uncertain etiology Hyperbilirubinemia 7.6>>5.4 - uncertain etiology  FEN: IV fluids/n.p.o. ID: Zosyn 1/29>> day 3 DVT: SCDs added Follow-up: To be determined  Plan: Dr. Barry Dienes has seen in place patient for surgery later today. I called the daughter stated below.  Also spoke with her sister who is out of town.  They are aware that the surgery will be around 1230 -1:00 o'clock today. Kristen Martinez, Kristen Martinez Daughter   [phone]      LOS: 2 days    Earnstine Regal 07/19/2018 [phone]

## 2018-07-19 NOTE — Anesthesia Procedure Notes (Signed)
Procedure Name: Intubation Date/Time: 07/19/2018 1:49 PM Performed by: Eben Burow, CRNA Pre-anesthesia Checklist: Patient identified, Emergency Drugs available, Suction available, Patient being monitored and Timeout performed Patient Re-evaluated:Patient Re-evaluated prior to induction Oxygen Delivery Method: Circle system utilized Preoxygenation: Pre-oxygenation with 100% oxygen Induction Type: IV induction and Rapid sequence Laryngoscope Size: Mac and 4 Grade View: Grade I Tube type: Oral Tube size: 7.0 mm Number of attempts: 1 Airway Equipment and Method: Stylet Placement Confirmation: ETT inserted through vocal cords under direct vision,  positive ETCO2 and breath sounds checked- equal and bilateral Tube secured with: Tape Dental Injury: Teeth and Oropharynx as per pre-operative assessment

## 2018-07-19 NOTE — Care Management Important Message (Signed)
Important Message  Patient Details  Name: Sekina Wurts MRN: [id number] Date of Birth: 1940/12/29   Medicare Important Message Given:  Yes    Caren Macadam 07/19/2018, 11:15 AMImportant Message  Patient Details  Name: Blakeli Sampley MRN: [id number] Date of Birth: 1940-08-04   Medicare Important Message Given:  Yes    Caren Macadam 07/19/2018, 11:15 AM

## 2018-07-19 NOTE — Progress Notes (Signed)
Pt. refused CPAP for this evening-GI procedure done today-Cholectomy, remains on 2 lpm n/c, made aware to notify if needed.

## 2018-07-19 NOTE — Progress Notes (Signed)
PROGRESS NOTE  Kristen Martinez DPO:[id number] DOB: [birth year] DOA: 07/17/2018 PCP: Patient, No Pcp Per   LOS: 2 days   Brief narrative: Patient is a 78 year old female with history of dementia, type 2 diabetes, obstructive sleep apnea on CPAP who recently moved to [place] from [place] to live near to her daughter. She was brought to the emergency room with 2 days history of abdominal pain, nausea and multiple episodes of vomiting.  In the emergency room, patient was found with elevated liver enzymes including elevated bilirubin, gallbladder stone with pericholecystic fluid as well as abnormal GE junction.  Followed by surgery and GI.  Assessment/Plan:  Principal Problem:   Choledocholithiasis with acute cholecystitis with obstruction Active Problems:   Cholecystitis   Liver disease   Hyponatremia   Prolonged QT interval   OSA on CPAP   Pericardial effusion   Dementia (HCC)   Ascites   Elevated LFTs   Obesity (BMI 30-39.9)  Acute cholecystitis/cholelithiasis -presented with acute right upper quadrant pain, nausea and vomiting, leukocytosis and elevated LFTs. MRCP obtained on 1/30 did not show any evidence of biliary dilation but showed findings suggestive of cholecystitis.  Patient most likely had CBD stone which she already passed, as evidenced by decreasing bilirubin level.  Patient will eventually need cholecystectomy. Currently on IV Zosyn, IV opiate.  Elevated liver enzymes  -likely secondary to CBD stones which patient has already passed by now.  No evidence of pancreatitis, no evidence of cirrhosis of liver.  Liver enzymes decreasing now.  Pending acute hepatitis panel.  Acute intra-abdominal hemorrhage  - unclear etiology.  MRCP 07/18/2018 showed an acute hemorrhage along the stomach tracks partially adjacent to the spleen, down to the left adrenal gland and to the left perirenal space.  No evidence of perforation.  No evidence of splenic artery aneurysm.  Hemoglobin dropped  from 13.4-11.8 today.  Continue to monitor.    Hyponatremia: Suspect secondary to dehydration.  Will continue on isotonic saline and recheck level in the morning.  Acute kidney injury: Blood pressures are stable.  Continue on maintenance IV fluids.  Recheck level in the morning.  Obstructive sleep apnea: On CPAP at night.  She will continue to use her own CPAP.  DVT prophylaxis: SCDs. Code Status: Full code. Disposition Plan: Unknown at this time.   Antibiotics: Antibiotics Given (last 72 hours)    Date/Time Action Medication Dose Rate   07/17/18 1847 New Bag/Given   ciprofloxacin (CIPRO) IVPB 400 mg 400 mg 200 mL/hr   07/17/18 2155 New Bag/Given   piperacillin-tazobactam (ZOSYN) IVPB 3.375 g 3.375 g 100 mL/hr   07/18/18 0620 New Bag/Given   piperacillin-tazobactam (ZOSYN) IVPB 3.375 g 3.375 g 12.5 mL/hr   07/18/18 1454 New Bag/Given   piperacillin-tazobactam (ZOSYN) IVPB 3.375 g 3.375 g 12.5 mL/hr   07/18/18 2207 New Bag/Given   piperacillin-tazobactam (ZOSYN) IVPB 3.375 g 3.375 g 12.5 mL/hr   07/19/18 0505 New Bag/Given   piperacillin-tazobactam (ZOSYN) IVPB 3.375 g 3.375 g 12.5 mL/hr      Continuous Infusions: . sodium chloride 150 mL/hr at 07/19/18 0506  . piperacillin-tazobactam (ZOSYN)  IV 3.375 g (07/19/18 0505)    Scheduled Meds: . insulin aspart  0-9 Units Subcutaneous Q4H  . ipratropium-albuterol  3 mL Nebulization Q6H  . pantoprazole (PROTONIX) IV  40 mg Intravenous Q12H    PRN meds: HYDROmorphone, oxyCODONE   Subjective: Patient was seen and examined this morning.  Pleasant elderly African-American female.  Still tender in the abdomen.  Objective: Vitals:  07/19/18 0638 07/19/18 0846  BP: 140/75 (!) 127/55  Pulse: (!) 103 (!) 102  Resp: 20 (!) 22  Temp:  99.8 F (37.7 C)  SpO2: 93% 94%    Intake/Output Summary (Last 24 hours) at 07/19/2018 1033 Last data filed at 07/19/2018 0400 Gross per 24 hour  Intake 1923.29 ml  Output -  Net 1923.29  ml   Filed Weights   07/17/18 1225  Weight: 81.6 kg   Body mass index is 32.92 kg/m.   Physical Exam: GENERAL: Pleasant elderly, African-American female HENT: No scleral pallor or icterus. Pupils equally reactive to light. Oral mucosa is moist NECK: is supple, no palpable thyroid enlargement. CHEST: Clear to auscultation. No crackles or wheezes. Non tender on palpation. Diminished breath sounds bilaterally. CVS: S1 and S2 heard, no murmur. Regular rate and rhythm. No pericardial rub. ABDOMEN: Soft, non-tender, bowel sounds are present. No palpable hepato-splenomegaly. EXTREMITIES: No edema.  CNS: Unremarkable oriented x3 SKIN: warm and dry without rashes.  Data Review: I have personally reviewed the laboratory data and studies available.  Lorin GlassBinaya Lashannon Bresnan, MD  [facility] 07/19/2018

## 2018-07-19 NOTE — Anesthesia Preprocedure Evaluation (Addendum)
Anesthesia Evaluation  Patient identified by MRN, date of birth, ID band Patient awake    Reviewed: Allergy & Precautions, NPO status , Patient's Chart, lab work & pertinent test results  Airway Mallampati: I  TM Distance: >3 FB Neck ROM: Full    Dental no notable dental hx. (+) Edentulous Upper, Edentulous Lower   Pulmonary sleep apnea and Continuous Positive Airway Pressure Ventilation ,    Pulmonary exam normal breath sounds clear to auscultation       Cardiovascular hypertension, negative cardio ROS Normal cardiovascular exam Rhythm:Regular Rate:Normal  TTE 06/2018 EF 55-60%, , no valvular abnormalities   Neuro/Psych PSYCHIATRIC DISORDERS Dementia negative neurological ROS     GI/Hepatic Neg liver ROS, GERD  Medicated and Poorly Controlled,  Endo/Other  negative endocrine ROSdiabetes, Type 2  Renal/GU negative Renal ROS  negative genitourinary   Musculoskeletal negative musculoskeletal ROS (+)   Abdominal   Peds  Hematology negative hematology ROS (+)   Anesthesia Other Findings gallstones  Reproductive/Obstetrics                            Anesthesia Physical Anesthesia Plan  ASA: II  Anesthesia Plan: General   Post-op Pain Management:    Induction: Intravenous and Rapid sequence  PONV Risk Score and Plan: 3 and Dexamethasone, Ondansetron and Treatment may vary due to age or medical condition  Airway Management Planned: Oral ETT  Additional Equipment:   Intra-op Plan:   Post-operative Plan: Extubation in OR  Informed Consent: I have reviewed the patients History and Physical, chart, labs and discussed the procedure including the risks, benefits and alternatives for the proposed anesthesia with the patient or authorized representative who has indicated his/her understanding and acceptance.     Dental advisory given  Plan Discussed with: CRNA  Anesthesia Plan Comments:         Anesthesia Quick Evaluation

## 2018-07-20 LAB — CBC WITH DIFFERENTIAL/PLATELET
Abs Immature Granulocytes: 0.22 10*3/uL — ABNORMAL HIGH (ref 0.00–0.07)
Basophils Absolute: 0 10*3/uL (ref 0.0–0.1)
Basophils Relative: 0 %
Eosinophils Absolute: 0 10*3/uL (ref 0.0–0.5)
Eosinophils Relative: 0 %
HCT: 27.7 % — ABNORMAL LOW (ref 36.0–46.0)
Hemoglobin: 8.7 g/dL — ABNORMAL LOW (ref 12.0–15.0)
Immature Granulocytes: 1 %
Lymphocytes Relative: 5 %
Lymphs Abs: 0.8 10*3/uL (ref 0.7–4.0)
MCH: 27.6 pg (ref 26.0–34.0)
MCHC: 31.4 g/dL (ref 30.0–36.0)
MCV: 87.9 fL (ref 80.0–100.0)
Monocytes Absolute: 1.3 10*3/uL — ABNORMAL HIGH (ref 0.1–1.0)
Monocytes Relative: 8 %
Neutro Abs: 13.6 10*3/uL — ABNORMAL HIGH (ref 1.7–7.7)
Neutrophils Relative %: 86 %
Platelets: 324 10*3/uL (ref 150–400)
RBC: 3.15 MIL/uL — ABNORMAL LOW (ref 3.87–5.11)
RDW: 15.9 % — ABNORMAL HIGH (ref 11.5–15.5)
WBC: 16 10*3/uL — ABNORMAL HIGH (ref 4.0–10.5)
nRBC: 0.1 % (ref 0.0–0.2)

## 2018-07-20 LAB — COMPREHENSIVE METABOLIC PANEL
ALT: 82 U/L — ABNORMAL HIGH (ref 0–44)
AST: 84 U/L — ABNORMAL HIGH (ref 15–41)
Albumin: 2.2 g/dL — ABNORMAL LOW (ref 3.5–5.0)
Alkaline Phosphatase: 153 U/L — ABNORMAL HIGH (ref 38–126)
Anion gap: 8 (ref 5–15)
BUN: 17 mg/dL (ref 8–23)
CO2: 21 mmol/L — ABNORMAL LOW (ref 22–32)
Calcium: 8.1 mg/dL — ABNORMAL LOW (ref 8.9–10.3)
Chloride: 107 mmol/L (ref 98–111)
Creatinine, Ser: 1.18 mg/dL — ABNORMAL HIGH (ref 0.44–1.00)
GFR calc Af Amer: 52 mL/min — ABNORMAL LOW (ref >60)
GFR calc non Af Amer: 45 mL/min — ABNORMAL LOW (ref >60)
Glucose, Bld: 140 mg/dL — ABNORMAL HIGH (ref 70–99)
Potassium: 3.5 mmol/L (ref 3.5–5.1)
Sodium: 136 mmol/L (ref 135–145)
Total Bilirubin: 2.3 mg/dL — ABNORMAL HIGH (ref 0.3–1.2)
Total Protein: 5 g/dL — ABNORMAL LOW (ref 6.5–8.1)

## 2018-07-20 LAB — GLUCOSE, CAPILLARY
Glucose-Capillary: 113 mg/dL — ABNORMAL HIGH (ref 70–99)
Glucose-Capillary: 115 mg/dL — ABNORMAL HIGH (ref 70–99)
Glucose-Capillary: 118 mg/dL — ABNORMAL HIGH (ref 70–99)
Glucose-Capillary: 133 mg/dL — ABNORMAL HIGH (ref 70–99)
Glucose-Capillary: 134 mg/dL — ABNORMAL HIGH (ref 70–99)
Glucose-Capillary: 143 mg/dL — ABNORMAL HIGH (ref 70–99)

## 2018-07-20 MED ORDER — DULOXETINE HCL 20 MG PO CPEP
20.0000 mg | ORAL_CAPSULE | Freq: Every day | ORAL | Status: DC
  Administered 2018-07-20 – 2018-07-28 (×9): 20 mg via ORAL
  Filled 2018-07-20 (×10): qty 1

## 2018-07-20 NOTE — Progress Notes (Signed)
PROGRESS NOTE  Kristen Martinez CHY:[id number] DOB: [birth year] DOA: 07/17/2018 PCP: Patient, No Pcp Per   LOS: 3 days   Brief narrative: Patient is a 78 year old female with history of dementia, type 2 diabetes, obstructive sleep apnea on CPAP who recently moved to [place] from [place] to live near to her daughter. She was brought to the emergency room with 2 days history of abdominal pain, nausea and multiple episodes of vomiting. In the emergency room, patient was found with elevated liver enzymes including elevated bilirubin, gallbladder stone with pericholecystic fluid as well as abnormal GE junction. Followed by surgery and GI.  Assessment/Plan:  Principal Problem:   Cholecystitis Active Problems:   Liver disease   Hyponatremia   Prolonged QT interval   OSA on CPAP   Pericardial effusion   Dementia (HCC)   Ascites   Elevated LFTs   Obesity (BMI 30-39.9)   Choledocholithiasis with acute cholecystitis with obstruction  Acute cholecystitis/cholelithiasis - presented with acute right upper quadrant pain, nausea and vomiting, leukocytosis and elevated LFTs. MRCP obtained on 1/30 did not show any evidence of biliary dilation but showed findings suggestive of cholecystitis. Patient most likely had CBD stone which she already passed, as evidenced by decreasing bilirubin level. Patient underwent laparoscopic cholecystectomy yesterday. Currently on IV Zosyn, IV opiate.  Elevated liver enzymes  -likely secondary to CBD stones which patient has already passed by now.  No evidence of pancreatitis, no evidence of cirrhosis of liver.  Liver enzymes trending down.  Repeat levels tomorrow.  Acute intra-abdominal hemorrhage  - unclear etiology.  MRCP 07/18/2018 showed an acute hemorrhage along the stomach tracks partially adjacent to the spleen, down to the left adrenal gland and to the left perirenal space.  No evidence of perforation.  No evidence of splenic artery aneurysm.  Hemoglobin dropped  from 11.8 yesterday to 8.7 today.  Continue to monitor.    Hyponatremia:Suspect secondary to dehydration. Sodium level improved with IV hydration.  Acute kidney injury: Creatinine peaked at 1.69, improving with IV hydration, 1.18 today.    Obstructive sleep apnea:On CPAP at night. She will continue to use her own CPAP.  DVT prophylaxis:SCDs. Code Status:Full code. Disposition Plan:Unknown at this time.  Antibiotics: Antibiotics Given (last 72 hours)    Date/Time Action Medication Dose Rate   07/17/18 1847 New Bag/Given   ciprofloxacin (CIPRO) IVPB 400 mg 400 mg 200 mL/hr   07/17/18 2155 New Bag/Given   piperacillin-tazobactam (ZOSYN) IVPB 3.375 g 3.375 g 100 mL/hr   07/18/18 0620 New Bag/Given   piperacillin-tazobactam (ZOSYN) IVPB 3.375 g 3.375 g 12.5 mL/hr   07/18/18 1454 New Bag/Given   piperacillin-tazobactam (ZOSYN) IVPB 3.375 g 3.375 g 12.5 mL/hr   07/18/18 2207 New Bag/Given   piperacillin-tazobactam (ZOSYN) IVPB 3.375 g 3.375 g 12.5 mL/hr   07/19/18 0505 New Bag/Given   piperacillin-tazobactam (ZOSYN) IVPB 3.375 g 3.375 g 12.5 mL/hr   07/19/18 1358 Given   piperacillin-tazobactam (ZOSYN) IVPB 3.375 g 3.375 g    07/19/18 2107 New Bag/Given   piperacillin-tazobactam (ZOSYN) IVPB 3.375 g 3.375 g 12.5 mL/hr   07/20/18 0500 New Bag/Given   piperacillin-tazobactam (ZOSYN) IVPB 3.375 g 3.375 g 12.5 mL/hr      Continuous Infusions:  . sodium chloride 150 mL/hr at 07/20/18 1258  . piperacillin-tazobactam (ZOSYN)  IV Stopped (07/20/18 0842)    Scheduled Meds: . insulin aspart  0-9 Units Subcutaneous Q4H  . ipratropium-albuterol  3 mL Nebulization BID  . pantoprazole (PROTONIX) IV  40 mg Intravenous Q12H  PRN meds: HYDROmorphone, ipratropium-albuterol, oxyCODONE   Subjective: Patient was seen and examined this morning.  Pleasant elderly African-American female.  Lying down in bed.  Not in distress.  Pain tolerable.  Not passing gas.  No bowel movement  yet.  Objective: Vitals:   07/20/18 1137 07/20/18 1318  BP: (!) 149/68 (!) 142/54  Pulse: 92 92  Resp:    Temp: 98.6 F (37 C) 97.9 F (36.6 C)  SpO2: 92%     Intake/Output Summary (Last 24 hours) at 07/20/2018 1357 Last data filed at 07/20/2018 1258 Gross per 24 hour  Intake 4555.42 ml  Output 2030 ml  Net 2525.42 ml   Filed Weights   07/17/18 1225 07/19/18 1229  Weight: 81.6 kg 81.6 kg   Body mass index is 32.92 kg/m.   Physical Exam: GENERAL: Pleasant elderly African-American female.  Not in distress. HENT: No scleral pallor or icterus. Pupils equally reactive to light. Oral mucosa is moist NECK: is supple, no palpable thyroid enlargement. CHEST: Clear to auscultation. No crackles or wheezes. Non tender on palpation. Diminished breath sounds bilaterally. CVS: S1 and S2 heard, no murmur. Regular rate and rhythm. No pericardial rub. ABDOMEN: Soft, mild appropriate tenderness in the surgical area, No palpable hepato-splenomegaly. EXTREMITIES: No edema. CNS: Alert, awake oriented x3 SKIN: warm and dry without rashes.  Data Review: I have personally reviewed the laboratory data and studies available.  Lorin Glass, MD  [facility] 07/20/2018

## 2018-07-20 NOTE — Progress Notes (Signed)
1 Day Post-Op   Subjective/Chief Complaint: Seems anxious. Complains of soreness   Objective: Vital signs in last 24 hours: Temp:  [98.2 F (36.8 C)-99.3 F (37.4 C)] 99.1 F (37.3 C) (02/01 0759) Pulse Rate:  [74-97] 84 (02/01 0759) Resp:  [14-24] 14 (02/01 0759) BP: (121-165)/(56-79) 152/71 (02/01 0759) SpO2:  [93 %-100 %] 97 % (02/01 0833) Weight:  [81.6 kg] 81.6 kg (01/31 1229) Last BM Date: 07/19/18  Intake/Output from previous day: 01/31 0701 - 02/01 0700 In: 3556.7 [P.O.:360; I.V.:3043.1; IV Piggyback:153.7] Out: 1320 [Urine:950; Drains:70; Blood:300] Intake/Output this shift: Total I/O In: -  Out: 10 [Drains:10]  General appearance: anxious Resp: clear to auscultation bilaterally Cardio: regular rate and rhythm GI: soft, mild tenderness. drain output serosanguinous  Lab Results:  Recent Labs    07/19/18 0608 07/20/18 0535  WBC 18.4* 16.0*  HGB 11.8* 8.7*  HCT 37.7 27.7*  PLT 320 324   BMET Recent Labs    07/19/18 0608 07/20/18 0535  NA 134* 136  K 3.4* 3.5  CL 103 107  CO2 20* 21*  GLUCOSE 119* 140*  BUN 22 17  CREATININE 1.58* 1.18*  CALCIUM 8.1* 8.1*   PT/INR Recent Labs    07/17/18 2123 07/19/18 0608  LABPROT 15.6* 15.1  INR 1.25 1.20   ABG No results for input(s): PHART, HCO3 in the last 72 hours.  Invalid input(s): PCO2, PO2  Studies/Results: Mr Abdomen Mrcp Wo Contrast  Result Date: 07/18/2018 CLINICAL DATA:  Cholecystitis, cholelithiasis, fluid along the gastric wall and attic margin at CT, for further characterization. EXAM: MRI ABDOMEN WITHOUT CONTRAST  (INCLUDING MRCP) TECHNIQUE: Multiplanar multisequence MR imaging of the abdomen was performed. Heavily T2-weighted images of the biliary and pancreatic ducts were obtained, and three-dimensional MRCP images were rendered by post processing. COMPARISON:  CT scan dated 07/17/2018 IV contrast could not be administered due to renal insufficiency. FINDINGS: Lower chest: Small  bilateral pleural effusions. Mild cardiomegaly. Mild atelectasis in both lung bases. Hepatobiliary: Mild perihepatic ascites. Mild periportal edema carried no focal liver lesion is identified on today's noncontrast MRI. No biliary dilatation is observed. This 5.1 cm in long axis gallstone in the gallbladder along with layering sludge in the gallbladder. Gallbladder wall thickening is present. Pancreas: Trace peripancreatic stranding is thought to be from extrinsic causes rather than acute pancreatitis, although correlation with lipase levels would be suggested. No dorsal pancreatic duct dilatation. Spleen:  Unremarkable Adrenals/Urinary Tract: Although some of the hemorrhagic or proteinaceous fluid from around the stomach tracks around the lateral limb of the left adrenal gland, I am skeptical that the left adrenal gland is the source of the hemorrhage. There is some tracking of blood products along the top of the left perirenal space and down the retrorenal space. The kidney does not appear to be the epicenter of this process. Suspected cyst of the left mid kidney anteriorly. Stomach/Bowel: There is infiltrative fluid density with high T1 signal especially along the margins of the stomach including the lesser sac margin and inferior margin as well as the posterior margin. This also tracks up partially adjacent to the spleen, left adrenal gland, and around the left perirenal space margins as noted above. The gastroesophageal junction is surrounded by some of this complex fluid, which also tracks down around the falciform ligament and anterior margin of the lateral segment left hepatic lobe. The appearance favors hemorrhage given the high precontrast T1 signal. I do not identify a discrete mass of the distal esophagus or gastric wall or  of the adrenal gland 2 account for this apparent acute hemorrhage. I do not see findings of extraluminal gas to suggest a perforated ulcer. There is a considerable degree of colonic  diverticulosis but I do not see active diverticulitis in the upper abdomen. Vascular/Lymphatic: No pathologic adenopathy. Vascular contours appear normal. On the recent CT scan I do not observe definite active extravasation of contrast. The portal vein and its tributaries are mildly narrowed but without cavernous transformation. Other: In addition to the complex fluid noted above, there is a small amount of perihepatic ascites which is not complex. Musculoskeletal: Unremarkable IMPRESSION: 1. Abnormal complex fluid with high precontrast T1 signal favoring acute hemorrhage along the margin of the stomach, gastroesophageal junction, and tracking around the left hepatic lobe and falciform ligament. Some of this process also tracks around the left perirenal space down into the left retro renal space. The origin of the acute hemorrhage is not identified although the epicenter of the process is probably along the proximal gastric wall. Today's exam does not have IV contrast, but the prior CT scan did not appear to show active extravasation of contrast in the amount of hemorrhage appears similar to last night's exam. No extraluminal gas to suggest a perforated ulcer. The exact cause and origin of the hemorrhage is uncertain. 2. No overt nodularity of the liver or definite morphologic findings of cirrhosis. 3. Gallbladder wall thickening with cholelithiasis and sludge-correlate clinically in assessing for acute cholecystitis. 4. Small bilateral pleural effusions with mild cardiomegaly. 5. In addition to the above-noted hemorrhage, there is some mild simple perihepatic ascites which does not include blood products. 6. Considerable colonic diverticulosis. Electronically Signed   By: Gaylyn RongWalter  Liebkemann M.D.   On: 07/18/2018 13:39   Mr 3d Recon At Scanner  Result Date: 07/18/2018 CLINICAL DATA:  Cholecystitis, cholelithiasis, fluid along the gastric wall and attic margin at CT, for further characterization. EXAM: MRI  ABDOMEN WITHOUT CONTRAST  (INCLUDING MRCP) TECHNIQUE: Multiplanar multisequence MR imaging of the abdomen was performed. Heavily T2-weighted images of the biliary and pancreatic ducts were obtained, and three-dimensional MRCP images were rendered by post processing. COMPARISON:  CT scan dated 07/17/2018 IV contrast could not be administered due to renal insufficiency. FINDINGS: Lower chest: Small bilateral pleural effusions. Mild cardiomegaly. Mild atelectasis in both lung bases. Hepatobiliary: Mild perihepatic ascites. Mild periportal edema carried no focal liver lesion is identified on today's noncontrast MRI. No biliary dilatation is observed. This 5.1 cm in long axis gallstone in the gallbladder along with layering sludge in the gallbladder. Gallbladder wall thickening is present. Pancreas: Trace peripancreatic stranding is thought to be from extrinsic causes rather than acute pancreatitis, although correlation with lipase levels would be suggested. No dorsal pancreatic duct dilatation. Spleen:  Unremarkable Adrenals/Urinary Tract: Although some of the hemorrhagic or proteinaceous fluid from around the stomach tracks around the lateral limb of the left adrenal gland, I am skeptical that the left adrenal gland is the source of the hemorrhage. There is some tracking of blood products along the top of the left perirenal space and down the retrorenal space. The kidney does not appear to be the epicenter of this process. Suspected cyst of the left mid kidney anteriorly. Stomach/Bowel: There is infiltrative fluid density with high T1 signal especially along the margins of the stomach including the lesser sac margin and inferior margin as well as the posterior margin. This also tracks up partially adjacent to the spleen, left adrenal gland, and around the left perirenal space margins as  noted above. The gastroesophageal junction is surrounded by some of this complex fluid, which also tracks down around the falciform  ligament and anterior margin of the lateral segment left hepatic lobe. The appearance favors hemorrhage given the high precontrast T1 signal. I do not identify a discrete mass of the distal esophagus or gastric wall or of the adrenal gland 2 account for this apparent acute hemorrhage. I do not see findings of extraluminal gas to suggest a perforated ulcer. There is a considerable degree of colonic diverticulosis but I do not see active diverticulitis in the upper abdomen. Vascular/Lymphatic: No pathologic adenopathy. Vascular contours appear normal. On the recent CT scan I do not observe definite active extravasation of contrast. The portal vein and its tributaries are mildly narrowed but without cavernous transformation. Other: In addition to the complex fluid noted above, there is a small amount of perihepatic ascites which is not complex. Musculoskeletal: Unremarkable IMPRESSION: 1. Abnormal complex fluid with high precontrast T1 signal favoring acute hemorrhage along the margin of the stomach, gastroesophageal junction, and tracking around the left hepatic lobe and falciform ligament. Some of this process also tracks around the left perirenal space down into the left retro renal space. The origin of the acute hemorrhage is not identified although the epicenter of the process is probably along the proximal gastric wall. Today's exam does not have IV contrast, but the prior CT scan did not appear to show active extravasation of contrast in the amount of hemorrhage appears similar to last night's exam. No extraluminal gas to suggest a perforated ulcer. The exact cause and origin of the hemorrhage is uncertain. 2. No overt nodularity of the liver or definite morphologic findings of cirrhosis. 3. Gallbladder wall thickening with cholelithiasis and sludge-correlate clinically in assessing for acute cholecystitis. 4. Small bilateral pleural effusions with mild cardiomegaly. 5. In addition to the above-noted hemorrhage,  there is some mild simple perihepatic ascites which does not include blood products. 6. Considerable colonic diverticulosis. Electronically Signed   By: Gaylyn Rong M.D.   On: 07/18/2018 13:39    Anti-infectives: Anti-infectives (From admission, onward)   Start     Dose/Rate Route Frequency Ordered Stop   07/18/18 0600  piperacillin-tazobactam (ZOSYN) IVPB 3.375 g     3.375 g 12.5 mL/hr over 240 Minutes Intravenous Every 8 hours 07/17/18 2008     07/17/18 2030  piperacillin-tazobactam (ZOSYN) IVPB 3.375 g     3.375 g 100 mL/hr over 30 Minutes Intravenous  Once 07/17/18 2006 07/17/18 2242   07/17/18 1830  ciprofloxacin (CIPRO) IVPB 400 mg     400 mg 200 mL/hr over 60 Minutes Intravenous  Once 07/17/18 1822 07/17/18 2154      Assessment/Plan: s/p Procedure(s): LAPAROSCOPIC CHOLECYSTECTOMY (N/A) Advance diet  Family says she has underlying Alzheimer's dementia Ambulate Pod 1  LOS: 3 days    Chevis Pretty III 07/20/2018

## 2018-07-20 NOTE — Progress Notes (Signed)
Patient refuses CPAP 

## 2018-07-21 LAB — CBC WITH DIFFERENTIAL/PLATELET
Abs Immature Granulocytes: 1.17 10*3/uL — ABNORMAL HIGH (ref 0.00–0.07)
Basophils Absolute: 0.1 10*3/uL (ref 0.0–0.1)
Basophils Relative: 0 %
Eosinophils Absolute: 0.4 10*3/uL (ref 0.0–0.5)
Eosinophils Relative: 3 %
HCT: 27.2 % — ABNORMAL LOW (ref 36.0–46.0)
Hemoglobin: 8.5 g/dL — ABNORMAL LOW (ref 12.0–15.0)
Immature Granulocytes: 7 %
Lymphocytes Relative: 9 %
Lymphs Abs: 1.4 10*3/uL (ref 0.7–4.0)
MCH: 27.7 pg (ref 26.0–34.0)
MCHC: 31.3 g/dL (ref 30.0–36.0)
MCV: 88.6 fL (ref 80.0–100.0)
Monocytes Absolute: 1.9 10*3/uL — ABNORMAL HIGH (ref 0.1–1.0)
Monocytes Relative: 12 %
Neutro Abs: 11.1 10*3/uL — ABNORMAL HIGH (ref 1.7–7.7)
Neutrophils Relative %: 69 %
Platelets: 315 10*3/uL (ref 150–400)
RBC: 3.07 MIL/uL — ABNORMAL LOW (ref 3.87–5.11)
RDW: 16 % — ABNORMAL HIGH (ref 11.5–15.5)
WBC: 16.1 10*3/uL — ABNORMAL HIGH (ref 4.0–10.5)
nRBC: 0 % (ref 0.0–0.2)

## 2018-07-21 LAB — COMPREHENSIVE METABOLIC PANEL
ALT: 61 U/L — ABNORMAL HIGH (ref 0–44)
AST: 49 U/L — ABNORMAL HIGH (ref 15–41)
Albumin: 2.3 g/dL — ABNORMAL LOW (ref 3.5–5.0)
Alkaline Phosphatase: 142 U/L — ABNORMAL HIGH (ref 38–126)
Anion gap: 7 (ref 5–15)
BUN: 13 mg/dL (ref 8–23)
CO2: 23 mmol/L (ref 22–32)
Calcium: 8.4 mg/dL — ABNORMAL LOW (ref 8.9–10.3)
Chloride: 106 mmol/L (ref 98–111)
Creatinine, Ser: 0.97 mg/dL (ref 0.44–1.00)
GFR calc Af Amer: >60 mL/min (ref >60)
GFR calc non Af Amer: 57 mL/min — ABNORMAL LOW (ref >60)
Glucose, Bld: 117 mg/dL — ABNORMAL HIGH (ref 70–99)
Potassium: 3.1 mmol/L — ABNORMAL LOW (ref 3.5–5.1)
Sodium: 136 mmol/L (ref 135–145)
Total Bilirubin: 1.9 mg/dL — ABNORMAL HIGH (ref 0.3–1.2)
Total Protein: 5.5 g/dL — ABNORMAL LOW (ref 6.5–8.1)

## 2018-07-21 LAB — GLUCOSE, CAPILLARY
Glucose-Capillary: 113 mg/dL — ABNORMAL HIGH (ref 70–99)
Glucose-Capillary: 115 mg/dL — ABNORMAL HIGH (ref 70–99)
Glucose-Capillary: 115 mg/dL — ABNORMAL HIGH (ref 70–99)
Glucose-Capillary: 116 mg/dL — ABNORMAL HIGH (ref 70–99)
Glucose-Capillary: 140 mg/dL — ABNORMAL HIGH (ref 70–99)
Glucose-Capillary: 145 mg/dL — ABNORMAL HIGH (ref 70–99)

## 2018-07-21 MED ORDER — OXYBUTYNIN CHLORIDE 5 MG PO TABS
5.0000 mg | ORAL_TABLET | Freq: Two times a day (BID) | ORAL | Status: DC
  Administered 2018-07-21 – 2018-07-29 (×17): 5 mg via ORAL
  Filled 2018-07-21 (×17): qty 1

## 2018-07-21 MED ORDER — POTASSIUM CHLORIDE CRYS ER 20 MEQ PO TBCR
40.0000 meq | EXTENDED_RELEASE_TABLET | Freq: Once | ORAL | Status: AC
  Administered 2018-07-21: 40 meq via ORAL
  Filled 2018-07-21: qty 2

## 2018-07-21 MED ORDER — IPRATROPIUM-ALBUTEROL 0.5-2.5 (3) MG/3ML IN SOLN
3.0000 mL | Freq: Three times a day (TID) | RESPIRATORY_TRACT | Status: DC
  Administered 2018-07-22 – 2018-07-24 (×9): 3 mL via RESPIRATORY_TRACT
  Filled 2018-07-21 (×9): qty 3

## 2018-07-21 NOTE — Progress Notes (Signed)
PROGRESS NOTE  Kristen Martinez JXB:[id number] DOB: [birth year] DOA: 07/17/2018 PCP: Patient, No Pcp Per   LOS: 4 days   Brief narrative: Patient is a[age] year old female with history of dementia, type 2 diabetes, obstructive sleep apnea on CPAP who recently moved to [place] to live near to her daughter. She was brought to the emergency room with 2 days history of abdominal pain, nausea and multiple episodes of vomiting. In the emergency room,patientwas found with elevated liver enzymes including elevated bilirubin, gallbladder stone with pericholecystic fluid as well as abnormal GE junction. Followed by surgery and GI. Patient underwent lap cholecystectomy on 07/19/2018.  Assessment/Plan:  Principal Problem:   Cholecystitis Active Problems:   Liver disease   Hyponatremia   Prolonged QT interval   OSA on CPAP   Pericardial effusion   Dementia (HCC)   Ascites   Elevated LFTs   Obesity (BMI 30-39.9)   Choledocholithiasis with acute cholecystitis with obstruction  Acute cholecystitis/cholelithiasis- presented with acute right upper quadrant pain, nausea and vomiting, leukocytosis and elevated LFTs. MRCP obtained on 1/30 did not show any evidence of biliary dilation but showed findings suggestive of cholecystitis. Patient most likely had CBD stone which she already passed,as evidenced by decreasing bilirubin level. Patient underwent laparoscopic cholecystectomy on 07/19/2018. Currently on IV Zosyn, IV opiate.  Pain control.  JP drain on.  Surgery following.  Elevated liver enzymes- likely secondary to CBD stones which patient has already passed by now. No evidence of pancreatitis,no evidence of cirrhosis of liver. Liver enzymes trending down. Repeat levels tomorrow.  Acute intra-abdominal hemorrhage-unclear etiology. MRCP 07/18/2018 showed an acute hemorrhage along the stomach tracks partially adjacent to the spleen, down to the left adrenal gland and to the left  perirenalspace. No evidence of perforation. No evidence of splenic artery aneurysm. Hemoglobin dropped postoperatively, 8.5 today.  If continues to drop.  We may need to repeat imaging.  Hyponatremia:Sodium level was low at 121 on admission.  Suspect secondary to dehydration. Sodium level improved with IV hydration.  Now back to normal.  Acute kidney injury: Creatinine peaked at 1.69, improving with IV hydration, down to normal at 0.97 today.   Obstructive sleep apnea:On CPAP at night. Refuses to use it in the hospital.  Depression -continue Cymbalta  Mobility: Out of bed today Diet: Regular diet DVT prophylaxis:  SCDs Code Status:   Code Status: Full Code  Family Communication:  Family member not present at bedside Disposition Plan:  Unknown at this time  Antibiotics: . IV Zosyn -1/29 -continue  Infusions:  . sodium chloride 50 mL/hr at 07/20/18 2136  . piperacillin-tazobactam (ZOSYN)  IV 3.375 g (07/21/18 0516)    Scheduled Meds: . DULoxetine  20 mg Oral QHS  . insulin aspart  0-9 Units Subcutaneous Q4H  . ipratropium-albuterol  3 mL Nebulization BID  . oxybutynin  5 mg Oral BID  . pantoprazole (PROTONIX) IV  40 mg Intravenous Q12H    PRN meds: HYDROmorphone, ipratropium-albuterol, oxyCODONE   Consultants:  GI  Surgery  Procedures:  Lap cholecystectomy on 07/19/2018   Subjective: Patient was seen and examined this morning.  Pleasant elderly African-American female.  Not in distress.  Pain controlled.  Objective: Vitals:   07/21/18 0430 07/21/18 0857  BP: (!) 148/69   Pulse: 85   Resp: 20   Temp: 98.6 F (37 C)   SpO2: 100% 96%    Intake/Output Summary (Last 24 hours) at 07/21/2018 1032 Last data filed at 07/21/2018 0500 Gross per 24 hour  Intake  1358.69 ml  Output 1475 ml  Net -116.31 ml   Filed Weights   07/17/18 1225 07/19/18 1229  Weight: 81.6 kg 81.6 kg   Body mass index is 32.92 kg/m.   Physical Exam: GENERAL: Pleasant elderly  African-American female.  Not in distress HENT: No scleral pallor or icterus. Pupils equally reactive to light. Oral mucosa is moist NECK: is supple, no palpable thyroid enlargement. CHEST: Clear to auscultation. No crackles or wheezes. Non tender on palpation. Diminished breath sounds bilaterally. CVS: S1 and S2 heard, no murmur. Regular rate and rhythm. No pericardial rub. ABDOMEN: Soft, non-tender, bowel sounds are present. No palpable hepato-splenomegaly. EXTREMITIES: No edema. CNS: Alert, awake, oriented x3 SKIN: warm and dry without rashes.  Data Review: I have personally reviewed the laboratory data and studies available.  CBC Latest Ref Rng & Units 07/21/2018 07/20/2018 07/19/2018  WBC 4.0 - 10.5 K/uL 16.1(H) 16.0(H) 18.4(H)  Hemoglobin 12.0 - 15.0 g/dL 2.2(V) 3.6(P) 11.8(L)  Hematocrit 36.0 - 46.0 % 27.2(L) 27.7(L) 37.7  Platelets 150 - 400 K/uL 315 324 320   BMP Latest Ref Rng & Units 07/21/2018 07/20/2018 07/19/2018  Glucose 70 - 99 mg/dL 224(S) 975(P) 005(R)  BUN 8 - 23 mg/dL 13 17 22   Creatinine 0.44 - 1.00 mg/dL 1.02 1.11(N) 3.56(P)  Sodium 135 - 145 mmol/L 136 136 134(L)  Potassium 3.5 - 5.1 mmol/L 3.1(L) 3.5 3.4(L)  Chloride 98 - 111 mmol/L 106 107 103  CO2 22 - 32 mmol/L 23 21(L) 20(L)  Calcium 8.9 - 10.3 mg/dL 0.1(I) 8.1(L) 8.1(L)    Lorin Glass, MD  [facility] 07/21/2018

## 2018-07-21 NOTE — Progress Notes (Signed)
2 Days Post-Op   Subjective/Chief Complaint: Feels better today   Objective: Vital signs in last 24 hours: Temp:  [97.9 F (36.6 C)-98.6 F (37 C)] 98.6 F (37 C) (02/02 0430) Pulse Rate:  [85-96] 85 (02/02 0430) Resp:  [20-22] 20 (02/02 0430) BP: (142-155)/(54-69) 148/69 (02/02 0430) SpO2:  [92 %-100 %] 96 % (02/02 0857) Last BM Date: 07/19/18  Intake/Output from previous day: 02/01 0701 - 02/02 0700 In: 1358.7 [P.O.:360; I.V.:952.3; IV Piggyback:46.4] Out: 1485 [Urine:1400; Drains:85] Intake/Output this shift: No intake/output data recorded.  General appearance: alert and cooperative Resp: clear to auscultation bilaterally Cardio: regular rate and rhythm GI: soft, minimal tenderness. drain output not bilious  Lab Results:  Recent Labs    07/20/18 0535 07/21/18 0539  WBC 16.0* 16.1*  HGB 8.7* 8.5*  HCT 27.7* 27.2*  PLT 324 315   BMET Recent Labs    07/20/18 0535 07/21/18 0539  NA 136 136  K 3.5 3.1*  CL 107 106  CO2 21* 23  GLUCOSE 140* 117*  BUN 17 13  CREATININE 1.18* 0.97  CALCIUM 8.1* 8.4*   PT/INR Recent Labs    07/19/18 0608  LABPROT 15.1  INR 1.20   ABG No results for input(s): PHART, HCO3 in the last 72 hours.  Invalid input(s): PCO2, PO2  Studies/Results: No results found.  Anti-infectives: Anti-infectives (From admission, onward)   Start     Dose/Rate Route Frequency Ordered Stop   07/18/18 0600  piperacillin-tazobactam (ZOSYN) IVPB 3.375 g     3.375 g 12.5 mL/hr over 240 Minutes Intravenous Every 8 hours 07/17/18 2008     07/17/18 2030  piperacillin-tazobactam (ZOSYN) IVPB 3.375 g     3.375 g 100 mL/hr over 30 Minutes Intravenous  Once 07/17/18 2006 07/17/18 2242   07/17/18 1830  ciprofloxacin (CIPRO) IVPB 400 mg     400 mg 200 mL/hr over 60 Minutes Intravenous  Once 07/17/18 1822 07/17/18 2154      Assessment/Plan: s/p Procedure(s): LAPAROSCOPIC CHOLECYSTECTOMY (N/A) Advance diet. Start vegetarian diet Will leave  drain in If she tolerates diet then ok for d/c from surgery standpoint. Will need to follow up this week in clinic to have drain removed  LOS: 4 days    Kristen Martinez 07/21/2018

## 2018-07-21 NOTE — Anesthesia Postprocedure Evaluation (Signed)
Anesthesia Post Note  Patient: Micki Query  Procedure(s) Performed: LAPAROSCOPIC CHOLECYSTECTOMY (N/A )     Patient location during evaluation: PACU Anesthesia Type: General Level of consciousness: awake and alert Pain management: pain level controlled Vital Signs Assessment: post-procedure vital signs reviewed and stable Respiratory status: spontaneous breathing, nonlabored ventilation, respiratory function stable and patient connected to nasal cannula oxygen Cardiovascular status: blood pressure returned to baseline and stable Postop Assessment: no apparent nausea or vomiting Anesthetic complications: no    Last Vitals:  Vitals:   07/21/18 1034 07/21/18 1411  BP:  132/61  Pulse:  95  Resp:  20  Temp:  37.7 C  SpO2: 97% 100%    Last Pain:  Vitals:   07/21/18 1612  TempSrc:   PainSc: 5                  Keondre Markson L Jerimie Mancuso

## 2018-07-22 ENCOUNTER — Encounter (HOSPITAL_COMMUNITY): Payer: Self-pay | Admitting: General Surgery

## 2018-07-22 ENCOUNTER — Inpatient Hospital Stay (HOSPITAL_COMMUNITY): Payer: Medicare Other

## 2018-07-22 LAB — CBC WITH DIFFERENTIAL/PLATELET
Abs Immature Granulocytes: 2.42 10*3/uL — ABNORMAL HIGH (ref 0.00–0.07)
Basophils Absolute: 0.1 10*3/uL (ref 0.0–0.1)
Basophils Relative: 0 %
Eosinophils Absolute: 0.5 10*3/uL (ref 0.0–0.5)
Eosinophils Relative: 3 %
HCT: 26.9 % — ABNORMAL LOW (ref 36.0–46.0)
Hemoglobin: 8.1 g/dL — ABNORMAL LOW (ref 12.0–15.0)
Immature Granulocytes: 13 %
Lymphocytes Relative: 10 %
Lymphs Abs: 1.8 10*3/uL (ref 0.7–4.0)
MCH: 27.3 pg (ref 26.0–34.0)
MCHC: 30.1 g/dL (ref 30.0–36.0)
MCV: 90.6 fL (ref 80.0–100.0)
Monocytes Absolute: 2 10*3/uL — ABNORMAL HIGH (ref 0.1–1.0)
Monocytes Relative: 11 %
Neutro Abs: 11.6 10*3/uL — ABNORMAL HIGH (ref 1.7–7.7)
Neutrophils Relative %: 63 %
Platelets: 309 10*3/uL (ref 150–400)
RBC: 2.97 MIL/uL — ABNORMAL LOW (ref 3.87–5.11)
RDW: 16.1 % — ABNORMAL HIGH (ref 11.5–15.5)
WBC: 18.4 10*3/uL — ABNORMAL HIGH (ref 4.0–10.5)
nRBC: 0.2 % (ref 0.0–0.2)

## 2018-07-22 LAB — URINALYSIS, ROUTINE W REFLEX MICROSCOPIC
Bilirubin Urine: NEGATIVE
Glucose, UA: NEGATIVE mg/dL
Ketones, ur: NEGATIVE mg/dL
Leukocytes, UA: NEGATIVE
Nitrite: NEGATIVE
Protein, ur: NEGATIVE mg/dL
Specific Gravity, Urine: 1.008 (ref 1.005–1.030)
pH: 5 (ref 5.0–8.0)

## 2018-07-22 LAB — COMPREHENSIVE METABOLIC PANEL
ALT: 44 U/L (ref 0–44)
AST: 37 U/L (ref 15–41)
Albumin: 2.1 g/dL — ABNORMAL LOW (ref 3.5–5.0)
Alkaline Phosphatase: 136 U/L — ABNORMAL HIGH (ref 38–126)
Anion gap: 7 (ref 5–15)
BUN: 8 mg/dL (ref 8–23)
CO2: 22 mmol/L (ref 22–32)
Calcium: 8.3 mg/dL — ABNORMAL LOW (ref 8.9–10.3)
Chloride: 107 mmol/L (ref 98–111)
Creatinine, Ser: 0.86 mg/dL (ref 0.44–1.00)
GFR calc Af Amer: >60 mL/min (ref >60)
GFR calc non Af Amer: >60 mL/min (ref >60)
Glucose, Bld: 122 mg/dL — ABNORMAL HIGH (ref 70–99)
Potassium: 3.4 mmol/L — ABNORMAL LOW (ref 3.5–5.1)
Sodium: 136 mmol/L (ref 135–145)
Total Bilirubin: 1.2 mg/dL (ref 0.3–1.2)
Total Protein: 5.3 g/dL — ABNORMAL LOW (ref 6.5–8.1)

## 2018-07-22 LAB — GLUCOSE, CAPILLARY
Glucose-Capillary: 105 mg/dL — ABNORMAL HIGH (ref 70–99)
Glucose-Capillary: 111 mg/dL — ABNORMAL HIGH (ref 70–99)
Glucose-Capillary: 118 mg/dL — ABNORMAL HIGH (ref 70–99)
Glucose-Capillary: 121 mg/dL — ABNORMAL HIGH (ref 70–99)
Glucose-Capillary: 126 mg/dL — ABNORMAL HIGH (ref 70–99)
Glucose-Capillary: 142 mg/dL — ABNORMAL HIGH (ref 70–99)

## 2018-07-22 LAB — LIPASE, BLOOD: Lipase: 29 U/L (ref 11–51)

## 2018-07-22 MED ORDER — IBUPROFEN 200 MG PO TABS: 600.0000 mg | ORAL_TABLET | Freq: Four times a day (QID) | ORAL | Status: DC | PRN

## 2018-07-22 MED ORDER — HYDROMORPHONE HCL 1 MG/ML IJ SOLN: 0.5000 mg | INTRAMUSCULAR | Status: DC | PRN

## 2018-07-22 MED ORDER — TRAMADOL HCL 50 MG PO TABS: 100.0000 mg | ORAL_TABLET | Freq: Two times a day (BID) | ORAL | Status: DC | PRN

## 2018-07-22 MED ORDER — ACETAMINOPHEN 500 MG PO TABS
1000.0000 mg | ORAL_TABLET | Freq: Three times a day (TID) | ORAL | Status: DC
  Administered 2018-07-22 – 2018-07-29 (×21): 1000 mg via ORAL
  Filled 2018-07-22 (×20): qty 2

## 2018-07-22 MED ORDER — OXYCODONE HCL 5 MG PO TABS
5.0000 mg | ORAL_TABLET | ORAL | Status: DC | PRN
  Administered 2018-07-23: 5 mg via ORAL
  Administered 2018-07-23 – 2018-07-25 (×4): 10 mg via ORAL
  Administered 2018-07-26: 5 mg via ORAL
  Filled 2018-07-22: qty 2
  Filled 2018-07-22: qty 1
  Filled 2018-07-22 (×4): qty 2

## 2018-07-22 MED ORDER — TRAMADOL HCL 50 MG PO TABS
100.0000 mg | ORAL_TABLET | Freq: Two times a day (BID) | ORAL | Status: DC | PRN
  Administered 2018-07-24: 100 mg via ORAL
  Filled 2018-07-22: qty 2

## 2018-07-22 MED ORDER — PANTOPRAZOLE SODIUM 40 MG PO TBEC
40.0000 mg | DELAYED_RELEASE_TABLET | Freq: Two times a day (BID) | ORAL | Status: DC
  Administered 2018-07-22 – 2018-07-29 (×14): 40 mg via ORAL
  Filled 2018-07-22 (×14): qty 1

## 2018-07-22 MED ORDER — POTASSIUM CHLORIDE CRYS ER 20 MEQ PO TBCR
20.0000 meq | EXTENDED_RELEASE_TABLET | Freq: Two times a day (BID) | ORAL | Status: AC
  Administered 2018-07-22 (×2): 20 meq via ORAL
  Filled 2018-07-22 (×2): qty 1

## 2018-07-22 NOTE — Progress Notes (Signed)
Key Points: Use following P&T approved IV to PO non-antibiotic change policy.  Description contains the criteria that are approved Note: Policy Excludes:  Esophagectomy patientsPHARMACIST - PHYSICIAN COMMUNICATION DR:   CCS CONCERNING: IV to Oral Route Change Policy  RECOMMENDATION: This patient is receiving protonix by the intravenous route.  Based on criteria approved by the Pharmacy and Therapeutics Committee, the intravenous medication(s) is/are being converted to the equivalent oral dose form(s).   DESCRIPTION: These criteria include:  The patient is eating (either orally or via tube) and/or has been taking other orally administered medications for a least 24 hours  The patient has no evidence of active gastrointestinal bleeding or impaired GI absorption (gastrectomy, short bowel, patient on TNA or NPO).  If you have questions about this conversion, please contact the Pharmacy Department  []   [phone] )  [facility] []   [phone] )  [facility]  []   [phone] )  [facility] [x]   [phone] )  [facility]  Earl Many Doniphan, [facility] 07/22/2018 12:39 PM

## 2018-07-22 NOTE — Progress Notes (Signed)
PROGRESS NOTE  Kristen Martinez ZOX:[id number] DOB: [birth year] DOA: 07/17/2018 PCP: Patient, No Pcp Per   LOS: 5 days   Brief narrative: Patient is a33 year old female with history of dementia, type 2 diabetes, obstructive sleep apnea on CPAP who recently moved to [place] from [place] to live near to her daughter. She was brought to the ED on 07/17/2018 with 2 days history of abdominal pain, nausea and multiple episodes of vomiting.  In the ED,patientwas found with elevated liver enzymes including elevated bilirubin, gallbladder stone with pericholecystic fluid as well as abnormal GE junction.  Surgery and GI consult were obtained.  MRCP was obtained on 1/30 which did not show any evidence of biliary dilation but showed findings suggestive of cholecystitis. Patient most likely had CBD stone which she already passed,as evidenced by decreasing bilirubin level.Patient underwent laparoscopic cholecystectomy on 07/19/2018.  Subjective: Patient was seen and examined this morning.  Patient has mostly been in bed.  Complains of cough and chest pain when coughing.  T-max 99.9 last 24 hours.  Patient denies any burning urination, shortness of breath or confusion.  Assessment/Plan:  Principal Problem:   Cholecystitis Active Problems:   Liver disease   Hyponatremia   Prolonged QT interval   OSA on CPAP   Pericardial effusion   Dementia (HCC)   Ascites   Elevated LFTs   Obesity (BMI 30-39.9)   Choledocholithiasis with acute cholecystitis with obstruction  Acute cholecystitis/cholelithiasis-status post lap chole. Currently on IV Zosyn, IV opiate.  Continue pain control.  Encourage out of bed and ambulation.  JP drain with old blood.  Neurosurgery following.  Elevated liver enzymes- likely secondary to CBD stones which patient has already passed by now. No evidence of pancreatitis,no evidence of cirrhosis of liver. Liver enzymestrending down towards normal. Repeat levels  tomorrow.  Acute intra-abdominal hemorrhage-unclear etiology. MRCP 07/18/2018 showed an acute hemorrhage along the stomach tracks partially adjacent to the spleen, down to the left adrenal gland and to the left perirenalspace. No evidence of perforation. No evidence of splenic artery aneurysm. Hemoglobin has been dropping postoperatively.  Down to 8.1 today.  Surgery following.    Low-grade temperature -T-max 99.9 last 24 hours.  WBC count increased to 18.4 today.  Patient has not been mobile. Left basilar airspace disease and likely small effusion worrisome for pneumonia. Chest x-ray was obtained today which showed left basilar airspace disease and likely small effusion worrisome for pneumonia.  Patient is already on IV Zosyn which we will continue for now.  Hyponatremia:Sodium level was low at 121 on admission.  Suspect secondary to dehydration. Sodium level improved with IV hydration.  Now back to normal.  Acute kidney injury:Creatinine peaked at 1.69, improving with IV hydration, down to normal now.  Obstructive sleep apnea:On CPAP at night. Refuses to use it in the hospital.  Depression -continue Cymbalta  Mobility:  Encourage out of bed.  Pending PT eval today. Diet: Regular diet DVT prophylaxis: SCDs Code Status:  Code Status: Full Code  Family Communication: Family member not present at bedside Disposition Plan: Unknown at this time  Consultants:  GI  Surgery  Procedures:  Lap cholecystectomy on 07/19/2018  Antimicrobials: Anti-infectives (From admission, onward)   Start     Dose/Rate Route Frequency Ordered Stop   07/18/18 0600  piperacillin-tazobactam (ZOSYN) IVPB 3.375 g     3.375 g 12.5 mL/hr over 240 Minutes Intravenous Every 8 hours 07/17/18 2008     07/17/18 2030  piperacillin-tazobactam (ZOSYN) IVPB 3.375 g     3.375  g 100 mL/hr over 30 Minutes Intravenous  Once 07/17/18 2006 07/17/18 2242   07/17/18 1830  ciprofloxacin (CIPRO) IVPB 400 mg      400 mg 200 mL/hr over 60 Minutes Intravenous  Once 07/17/18 1822 07/17/18 2154      Infusions: . sodium chloride 50 mL/hr at 07/22/18 0232  . piperacillin-tazobactam (ZOSYN)  IV 3.375 g (07/22/18 0558)    Scheduled Meds: . acetaminophen  1,000 mg Oral Q8H  . DULoxetine  20 mg Oral QHS  . insulin aspart  0-9 Units Subcutaneous Q4H  . ipratropium-albuterol  3 mL Nebulization TID  . oxybutynin  5 mg Oral BID  . pantoprazole (PROTONIX) IV  40 mg Intravenous Q12H  . potassium chloride  20 mEq Oral BID PC    PRN meds: HYDROmorphone, ibuprofen, ipratropium-albuterol, oxyCODONE, traMADol   Objective: Vitals:   07/22/18 0422 07/22/18 0441  BP: 125/72   Pulse: 79   Resp: (!) 22   Temp: 98.4 F (36.9 C)   SpO2: 97% 95%    Intake/Output Summary (Last 24 hours) at 07/22/2018 1236 Last data filed at 07/22/2018 1148 Gross per 24 hour  Intake 2122.45 ml  Output 590 ml  Net 1532.45 ml   02/02 0701 - 02/03 0700 In: 2482.5 [P.O.:360; I.V.:1872.5; IV Piggyback:250] Out: 260 [Urine:200; Drains:60] Total I/O In: -  Out: 330 [Urine:300; Drains:30] Filed Weights   07/17/18 1225 07/19/18 1229  Weight: 81.6 kg 81.6 kg   Body mass index is 32.92 kg/m.   Physical Exam: GENERAL: Pleasant elderly African-American female.  Not in distress HENT: No scleral pallor or icterus. Pupils equally reactive to light. Oral mucosa is moist NECK: is supple, no palpable thyroid enlargement. CHEST: Diminished air entry in both bases, no crackles or wheezing. CVS: S1 and S2 heard, no murmur. Regular rate and rhythm. No pericardial rub. ABDOMEN: Soft, bowel sounds are present.  Appropriate tenderness around incision site.  JP drain with dark old blood. No palpable hepato-splenomegaly. EXTREMITIES: No edema. CNS: Drowsy.  Opens eyes on verbal command.  Seems lethargic today. SKIN: warm and dry without rashes.  Data Review: I have personally reviewed the laboratory data and studies  available.  Recent Labs  Lab 07/18/18 0520 07/18/18 1505 07/19/18 0608 07/20/18 0535 07/21/18 0539 07/22/18 0547  WBC 21.1*  --  18.4* 16.0* 16.1* 18.4*  NEUTROABS  --   --  15.6* 13.6* 11.1* 11.6*  HGB 13.8 13.4 11.8* 8.7* 8.5* 8.1*  HCT 42.9 41.2 37.7 27.7* 27.2* 26.9*  MCV 85.1  --  89.1 87.9 88.6 90.6  PLT 380  --  320 324 315 309   Recent Labs  Lab 07/17/18 2123 07/18/18 0520 07/19/18 0608 07/20/18 0535 07/21/18 0539 07/22/18 0547  NA 121* 126* 134* 136 136 136  K 4.9 4.1 3.4* 3.5 3.1* 3.4*  CL 87* 92* 103 107 106 107  CO2 20* 17* 20* 21* 23 22  GLUCOSE 121* 110* 119* 140* 117* 122*  BUN 18 23 22 17 13 8   CREATININE 1.19* 1.69* 1.58* 1.18* 0.97 0.86  CALCIUM 8.6* 8.7* 8.1* 8.1* 8.4* 8.3*  MG 1.7 1.7  --   --   --   --   PHOS 3.8 4.2  --   --   --   --    Recent Labs  Lab 07/18/18 0520 07/19/18 0608 07/20/18 0535 07/21/18 0539 07/22/18 0547  AST 140* 60* 84* 49* 37  ALT 203* 114* 82* 61* 44  ALKPHOS 308* 223* 153* 142* 136*  BILITOT 7.9* 4.2*  4.5* 2.3* 1.9* 1.2  PROT 6.4* 5.8* 5.0* 5.5* 5.3*  ALBUMIN 3.0* 2.5* 2.2* 2.3* 2.1*   Lorin GlassBinaya Merril Isakson, MD  [facility] 07/22/2018

## 2018-07-22 NOTE — Progress Notes (Signed)
Patient refuses CPAP 

## 2018-07-22 NOTE — Evaluation (Signed)
Physical Therapy Evaluation Patient Details Name: Kristen Martinez MRN: [id number] DOB: 12/18/1940 Today's Date: 07/22/2018   History of Present Illness  78 year old female with history of dementia, type 2 diabetes, obstructive sleep apnea on CPAP who recently moved to [place] from [place] to live near to her daughter and admitted for Acute cholecystitis/cholelithiasis and s/p lap chole on 07/19/18  Clinical Impression  Pt admitted with above diagnosis. Pt currently with functional limitations due to the deficits listed below (see PT Problem List).  Pt will benefit from skilled PT to increase their independence and safety with mobility to allow discharge to the venue listed below.  Pt reports being independent at baseline and lives in senior living apts.  Pt very agreeable to mobilize.  Recommend HHPT upon d/c.     Follow Up Recommendations Home health PT    Equipment Recommendations  Rolling walker with 5" wheels    Recommendations for Other Services       Precautions / Restrictions Precautions Precautions: Fall Precaution Comments: JP drain      Mobility  Bed Mobility Overal bed mobility: Needs Assistance Bed Mobility: Supine to Sit     Supine to sit: Min assist     General bed mobility comments: assist for trunk upright per pt request  Transfers Overall transfer level: Needs assistance Equipment used: Rolling walker (2 wheeled) Transfers: Sit to/from Stand Sit to Stand: Min guard         General transfer comment: increased time and effort, cues for hand placement  Ambulation/Gait Ambulation/Gait assistance: Min guard Gait Distance (Feet): 200 Feet Assistive device: Rolling walker (2 wheeled) Gait Pattern/deviations: Step-through pattern;Decreased stride length;Trunk flexed     General Gait Details: verbal cues for safe use of RW, ambulated distance to tolerance, SPO2 90% on room air upon returning to room (pt having breathing treatment upon exiting  room)  Stairs            Wheelchair Mobility    Modified Rankin (Stroke Patients Only)       Balance Overall balance assessment: Needs assistance         Standing balance support: Bilateral upper extremity supported Standing balance-Leahy Scale: Poor Standing balance comment: requiring UE support at this time                             Pertinent Vitals/Pain Pain Assessment: No/denies pain    Home Living Family/patient expects to be discharged to:: Private residence Living Arrangements: Alone Available Help at Discharge: Family;Available PRN/intermittently Type of Home: Apartment       Home Layout: One level Home Equipment: Cane - single point Additional Comments: pt reports living in a senior living community    Prior Function Level of Independence: Independent               Hand Dominance        Extremity/Trunk Assessment        Lower Extremity Assessment Lower Extremity Assessment: Generalized weakness       Communication   Communication: No difficulties  Cognition Arousal/Alertness: Awake/alert Behavior During Therapy: WFL for tasks assessed/performed Overall Cognitive Status: Within Functional Limits for tasks assessed                                        General Comments      Exercises     Assessment/Plan  PT Assessment Patient needs continued PT services  PT Problem List Decreased strength;Decreased mobility;Decreased activity tolerance;Decreased knowledge of use of DME       PT Treatment Interventions Functional mobility training;DME instruction;Gait training;Therapeutic activities;Neuromuscular re-education;Balance training;Patient/family education;Stair training;Therapeutic exercise    PT Goals (Current goals can be found in the Care Plan section)  Acute Rehab PT Goals PT Goal Formulation: With patient Time For Goal Achievement: 08/05/18 Potential to Achieve Goals: Good     Frequency Min 3X/week   Barriers to discharge        Co-evaluation               AM-PAC PT "6 Clicks" Mobility  Outcome Measure Help needed turning from your back to your side while in a flat bed without using bedrails?: A Little Help needed moving from lying on your back to sitting on the side of a flat bed without using bedrails?: A Little Help needed moving to and from a bed to a chair (including a wheelchair)?: A Little Help needed standing up from a chair using your arms (e.g., wheelchair or bedside chair)?: A Little Help needed to walk in hospital room?: A Little Help needed climbing 3-5 steps with a railing? : A Lot 6 Click Score: 17    End of Session Equipment Utilized During Treatment: Gait belt Activity Tolerance: Patient tolerated treatment well Patient left: in chair;with call bell/phone within reach;with chair alarm set(with RT) Nurse Communication: Mobility status PT Visit Diagnosis: Difficulty in walking, not elsewhere classified (R26.2)    Time: [id number] PT Time Calculation (min) (ACUTE ONLY): 20 min   Charges:   PT Evaluation $PT Eval Low Complexity: 1 Low          Zenovia JarredKati Alivia Cimino, PT, DPT Acute Rehabilitation Services Office: [phone] Pager: [phone]  Sarajane JewsLEMYRE,KATHrine E 07/22/2018, 2:28 PM

## 2018-07-22 NOTE — Progress Notes (Signed)
3 Days Post-Op    CC: Abdominal pain  Subjective: Patient is alert and quite with her today.  Her daughter from [place] is in the room with her.  Patient is really pretty sore.  Drainage from the [place] looks like old blood. Patient has not been out of bed.  She lives alone and is normally independent.  The daughter with her today is scheduled for surgery tomorrow. Objective: Vital signs in last 24 hours: Temp:  [98.4 F (36.9 C)-99.9 F (37.7 C)] 98.4 F (36.9 C) (02/03 0422) Pulse Rate:  [79-95] 79 (02/03 0422) Resp:  [20-22] 22 (02/03 0422) BP: (125-137)/(59-72) 125/72 (02/03 0422) SpO2:  [95 %-100 %] 95 % (02/03 0441) Last BM Date: 07/19/18 360 PO recorded yesterday 2000 IV Urine 200 recorded Drain 60 Afebrile, VSS Last K+ 3.4 No IOC   Intake/Output from previous day: 02/02 0701 - 02/03 0700 In: 2482.5 [P.O.:360; I.V.:1872.5; IV Piggyback:250] Out: 260 [Urine:200; Drains:60] Intake/Output this shift: No intake/output data recorded.  General appearance: alert, cooperative and no distress Resp: clear to auscultation bilaterally and She has a productive cough. GI: Soft, sore, port sites all look good.  As noted above the drainage from the [place] is old blood.  She is tolerating a diet but no BM so far.  Lab Results:  Recent Labs    07/21/18 0539 07/22/18 0547  WBC 16.1* 18.4*  HGB 8.5* 8.1*  HCT 27.2* 26.9*  PLT 315 309    BMET Recent Labs    07/21/18 0539 07/22/18 0547  NA 136 136  K 3.1* 3.4*  CL 106 107  CO2 23 22  GLUCOSE 117* 122*  BUN 13 8  CREATININE 0.97 0.86  CALCIUM 8.4* 8.3*   PT/INR No results for input(s): LABPROT, INR in the last 72 hours.  Recent Labs  Lab 07/18/18 0520 07/19/18 0608 07/20/18 0535 07/21/18 0539 07/22/18 0547  AST 140* 60* 84* 49* 37  ALT 203* 114* 82* 61* 44  ALKPHOS 308* 223* 153* 142* 136*  BILITOT 7.9* 4.2*  4.5* 2.3* 1.9* 1.2  PROT 6.4* 5.8* 5.0* 5.5* 5.3*  ALBUMIN 3.0* 2.5* 2.2* 2.3* 2.1*     Lipase      Component Value Date/Time   LIPASE 32 07/19/2018 0608     Medications: . DULoxetine  20 mg Oral QHS  . insulin aspart  0-9 Units Subcutaneous Q4H  . ipratropium-albuterol  3 mL Nebulization TID  . oxybutynin  5 mg Oral BID  . pantoprazole (PROTONIX) IV  40 mg Intravenous Q12H   Anti-infectives (From admission, onward)   Start     Dose/Rate Route Frequency Ordered Stop   07/18/18 0600  piperacillin-tazobactam (ZOSYN) IVPB 3.375 g     3.375 g 12.5 mL/hr over 240 Minutes Intravenous Every 8 hours 07/17/18 2008     07/17/18 2030  piperacillin-tazobactam (ZOSYN) IVPB 3.375 g     3.375 g 100 mL/hr over 30 Minutes Intravenous  Once 07/17/18 2006 07/17/18 2242   07/17/18 1830  ciprofloxacin (CIPRO) IVPB 400 mg     400 mg 200 mL/hr over 60 Minutes Intravenous  Once 07/17/18 1822 07/17/18 2154     . sodium chloride 50 mL/hr at 07/22/18 0232  . piperacillin-tazobactam (ZOSYN)  IV 3.375 g (07/22/18 0558)   Assessment/Plan Hyperlipidemia + THC Elevated troponin 0.03 x 2, <0.03 x 1 Hyponatremia125>>121>>126>>134 >>136 Prediabetes - Glucose monitoring Leukocytosis - uncertain etilology AKI - creatinine 1.06>>1.19>>1.69>>1.58 Alk phos 372>> 308>> 223>>136 AST 350>> 140>> 60>> 37 ALT 308>> 203>> 114>>44  Total bilirubin 8.1>> 7.9>> 4.5>> 1.2 Lipase 144>>59>>32 Leukocytosis - WBC 18.1 Anemia - H/H 13.8/42.9 (1/31) >> 8.1/26.9 (today)  Abdominal pain, nausea and vomiting Mild pancreatitis - resolving 144>>59>>32 Acute hemorrhage along the margins of the stomach/GE junction- Liver bleeding on exam in OR  Hyperbilirubinemia 6.7>>1.2- uncertain etiology Laparoscopic cholecystectomy, Snow placed over bleeding area of the GB 07/19/2018, Dr. Stark Klein   FEN: IV fluids/vegitarian diet ID: Zosyn 1/29>> day 5 DVT: SCDs added Follow-up: To be determined  Plan: I am getting get her out of bed have OT and PT work on mobilizing her.  We will check a urine, chest x-ray, I have asked her  to start doing incentive 10 times an hour.  Can get him back her off on the IV pain medications and give her more oral medications.  Recheck CBC tomorrow. Continue Zosyn today.  LOS: 5 days    Geraldyne Barraclough 07/22/2018 [phone]

## 2018-07-22 NOTE — Care Management Important Message (Signed)
Important Message  Patient Details  Name: Kristen Martinez MRN: [id number] Date of Birth: 06-22-1940   Medicare Important Message Given:  Yes    Caren MacadamFuller, Carola Viramontes 07/22/2018, 11:20 AMImportant Message  Patient Details  Name: Kristen Martinez MRN: [id number] Date of Birth: 06-22-1940   Medicare Important Message Given:  Yes    Caren MacadamFuller, Adaleen Hulgan 07/22/2018, 11:20 AM

## 2018-07-23 ENCOUNTER — Other Ambulatory Visit: Payer: Self-pay

## 2018-07-23 DIAGNOSIS — K8000 Calculus of gallbladder with acute cholecystitis without obstruction: Secondary | ICD-10-CM

## 2018-07-23 LAB — CBC
HCT: 26.6 % — ABNORMAL LOW (ref 36.0–46.0)
Hemoglobin: 8 g/dL — ABNORMAL LOW (ref 12.0–15.0)
MCH: 27.6 pg (ref 26.0–34.0)
MCHC: 30.1 g/dL (ref 30.0–36.0)
MCV: 91.7 fL (ref 80.0–100.0)
Platelets: 338 10*3/uL (ref 150–400)
RBC: 2.9 MIL/uL — ABNORMAL LOW (ref 3.87–5.11)
RDW: 16.2 % — ABNORMAL HIGH (ref 11.5–15.5)
WBC: 17.1 10*3/uL — ABNORMAL HIGH (ref 4.0–10.5)
nRBC: 0.2 % (ref 0.0–0.2)

## 2018-07-23 LAB — HEPATIC FUNCTION PANEL
ALT: 33 U/L (ref 0–44)
AST: 28 U/L (ref 15–41)
Albumin: 2.1 g/dL — ABNORMAL LOW (ref 3.5–5.0)
Alkaline Phosphatase: 126 U/L (ref 38–126)
Bilirubin, Direct: 0.4 mg/dL — ABNORMAL HIGH (ref 0.0–0.2)
Indirect Bilirubin: 0.7 mg/dL (ref 0.3–0.9)
Total Bilirubin: 1.1 mg/dL (ref 0.3–1.2)
Total Protein: 5.5 g/dL — ABNORMAL LOW (ref 6.5–8.1)

## 2018-07-23 LAB — GLUCOSE, CAPILLARY
Glucose-Capillary: 103 mg/dL — ABNORMAL HIGH (ref 70–99)
Glucose-Capillary: 104 mg/dL — ABNORMAL HIGH (ref 70–99)
Glucose-Capillary: 106 mg/dL — ABNORMAL HIGH (ref 70–99)
Glucose-Capillary: 133 mg/dL — ABNORMAL HIGH (ref 70–99)
Glucose-Capillary: 91 mg/dL (ref 70–99)
Glucose-Capillary: 99 mg/dL (ref 70–99)

## 2018-07-23 LAB — CULTURE, BLOOD (ROUTINE X 2)
Culture: NO GROWTH
Culture: NO GROWTH
Special Requests: ADEQUATE
Special Requests: ADEQUATE

## 2018-07-23 LAB — AMYLASE, PLEURAL OR PERITONEAL FLUID: Amylase, Fluid: 10 U/L

## 2018-07-23 LAB — AMYLASE: Amylase: 26 U/L — ABNORMAL LOW (ref 28–100)

## 2018-07-23 MED ORDER — BISACODYL 10 MG RE SUPP
10.0000 mg | Freq: Every day | RECTAL | Status: DC
  Administered 2018-07-23 – 2018-07-25 (×3): 10 mg via RECTAL
  Filled 2018-07-23 (×4): qty 1

## 2018-07-23 MED ORDER — POLYETHYLENE GLYCOL 3350 17 G PO PACK
17.0000 g | PACK | Freq: Two times a day (BID) | ORAL | Status: DC
  Administered 2018-07-23 – 2018-07-26 (×7): 17 g via ORAL
  Filled 2018-07-23 (×11): qty 1

## 2018-07-23 MED ORDER — ONDANSETRON HCL 4 MG/2ML IJ SOLN
4.0000 mg | Freq: Four times a day (QID) | INTRAMUSCULAR | Status: DC | PRN
  Administered 2018-07-23: 4 mg via INTRAVENOUS
  Filled 2018-07-23: qty 2

## 2018-07-23 MED ORDER — BISACODYL 5 MG PO TBEC
5.0000 mg | DELAYED_RELEASE_TABLET | Freq: Every day | ORAL | Status: DC | PRN
  Administered 2018-07-23 – 2018-07-24 (×3): 5 mg via ORAL
  Filled 2018-07-23 (×3): qty 1

## 2018-07-23 NOTE — Progress Notes (Signed)
PROGRESS NOTE  Lorretta HarpBarbara Rhames ZOX:[id number] DOB: [birth year] DOA: 07/17/2018 PCP: Patient, No Pcp Per   LOS: 6 days   Brief narrative: Patient is a[age] year old female with history of dementia, type 2 diabetes, obstructive sleep apnea on CPAP who recently moved to [place] from [place] to live near to her daughter. She was brought to the ED on 07/17/2018 with 2 days history of abdominal pain, nausea and multiple episodes of vomiting.  In the ED,patientwas found with elevated liver enzymes including elevated bilirubin, gallbladder stone with pericholecystic fluid as well as abnormal GE junction.  Surgery and GI consult were obtained.  MRCP was obtained on 1/30 which did not show any evidence of biliary dilation but showed findings suggestive of cholecystitis. Patient most likely had CBD stone which she already passed,as evidenced by decreasing bilirubin level.Patient underwent laparoscopic cholecystectomyon 07/19/2018.  Per surgery note, patient had a gangrenous gallbladder with a large gallbladder stone which caused the gallbladder to press over the common bile duct causing obstructive jaundice (Mirrizzi syndrome).  Subjective: Patient was seen and examined this morning.  Pleasant elderly African-American female.  Lying down in bed.  Not in distress.  No new complaint today.  Assessment/Plan:  Principal Problem:   Acute calculous cholecystitis s/p lap cholecystectomy 07/19/2018 Active Problems:   Liver disease   Hyponatremia   Prolonged QT interval   OSA on CPAP   Pericardial effusion   Dementia (HCC)   Ascites   Elevated LFTs   Obesity (BMI 30-39.9)  Acute cholecystitis/cholelithiasis- status post lap chole. Currently on IV Zosyn. On oxycodone as needed for pain control. Encourage out of bed and ambulation. General surgery following.  JP drain sample sent by surgery for amylase and lipase.   Elevated liver enzymes- After cholecystectomy, liver enzymes have been steadily  improving as well.  Continue to trend.  Acute intra-abdominal hemorrhage-MRCP 07/18/2018 showed an acute hemorrhage along the stomach tracks partially adjacent to the spleen, down to the left adrenal gland and to the left perirenalspace. Unclear etiology.  No evidence of perforation. No evidence of splenic artery aneurysm. Hemoglobin has been gradually dropping, hb of 8 today.  Surgery following.    Low-grade temperature -T-max 100.6 in last 24 hours. WBC count remains high at 17,000 today.  Chest x-ray obtained yesterday showed left basilar airspace disease and likely small effusion worrisome for pneumonia.   I think it is mostly atelectasis.  Continue to encourage for ambulation, incentive spirometry.  Continue IV Zosyn for now.  Since patient had a gangrenous gallbladder, she is being monitored to rule out abscess formation.   Hyponatremia:Sodium level was low at 121 on admission.Suspect secondary to dehydration. Sodium level improved with IV hydration.Now back to normal.  Acute kidney injury:Creatinine peaked at 1.69, improving with IV hydration,down to normal now.  Obstructive sleep apnea:On CPAP at night. Refuses to use it in the hospital.  Depression-continue Cymbalta  Mobility:  Encourage ambulation.  PT eval appreciated. Diet:Regular diet DVT prophylaxis:SCDs Code Status:Code Status: Full Code Family Communication:I spoke with one of her daughters yesterday. Disposition Plan:Unknown at this time  Consultants:  GI  Surgery  Procedures:  Lap cholecystectomy on 07/19/2018  Antimicrobials:  Anti-infectives (From admission, onward)   Start     Dose/Rate Route Frequency Ordered Stop   07/18/18 0600  piperacillin-tazobactam (ZOSYN) IVPB 3.375 g     3.375 g 12.5 mL/hr over 240 Minutes Intravenous Every 8 hours 07/17/18 2008     07/17/18 2030  piperacillin-tazobactam (ZOSYN) IVPB 3.375 g  3.375 g 100 mL/hr over 30 Minutes Intravenous  Once  07/17/18 2006 07/17/18 2242   07/17/18 1830  ciprofloxacin (CIPRO) IVPB 400 mg     400 mg 200 mL/hr over 60 Minutes Intravenous  Once 07/17/18 1822 07/17/18 2154      Infusions:  . sodium chloride 10 mL/hr at 07/23/18 0800  . piperacillin-tazobactam (ZOSYN)  IV 3.375 g (07/23/18 1336)    Scheduled Meds: . acetaminophen  1,000 mg Oral Q8H  . bisacodyl  10 mg Rectal Daily  . DULoxetine  20 mg Oral QHS  . insulin aspart  0-9 Units Subcutaneous Q4H  . ipratropium-albuterol  3 mL Nebulization TID  . oxybutynin  5 mg Oral BID  . pantoprazole  40 mg Oral BID  . polyethylene glycol  17 g Oral BID    PRN meds: bisacodyl, HYDROmorphone, ibuprofen, ipratropium-albuterol, oxyCODONE, traMADol   Objective: Vitals:   07/23/18 0805 07/23/18 1407  BP:  (!) 151/72  Pulse:  88  Resp:  18  Temp:  99.1 F (37.3 C)  SpO2: 93% 100%    Intake/Output Summary (Last 24 hours) at 07/23/2018 1500 Last data filed at 07/23/2018 1200 Gross per 24 hour  Intake 1625.48 ml  Output 1250 ml  Net 375.48 ml   02/03 0701 - 02/04 0700 In: 240 [P.O.:240] Out: 780 [Urine:700; Drains:80] Total I/O In: 1625.5 [P.O.:1080; I.V.:375.6; IV Piggyback:169.9] Out: 800 [Urine:800] Filed Weights   07/17/18 1225 07/19/18 1229  Weight: 81.6 kg 81.6 kg   Body mass index is 32.92 kg/m.   Physical Exam: GENERAL: Pleasant elderly African-American female. HENT: No scleral pallor or icterus. Pupils equally reactive to light. Oral mucosa is moist NECK: is supple, no palpable thyroid enlargement. CHEST: Clear to auscultation. No crackles or wheezes.  CVS: S1 and S2 heard, no murmur. Regular rate and rhythm. No pericardial rub. ABDOMEN: Soft, appropriate postoperative tenderness, bowel sounds present.  JP drain in place. EXTREMITIES: No edema. CNS: Alert, awake, oriented x3  SKIN: warm and dry without rashes.  Data Review: I have personally reviewed the laboratory data and studies available.  Recent Labs  Lab  07/19/18 0608 07/20/18 0535 07/21/18 0539 07/22/18 0547 07/23/18 0620  WBC 18.4* 16.0* 16.1* 18.4* 17.1*  NEUTROABS 15.6* 13.6* 11.1* 11.6*  --   HGB 11.8* 8.7* 8.5* 8.1* 8.0*  HCT 37.7 27.7* 27.2* 26.9* 26.6*  MCV 89.1 87.9 88.6 90.6 91.7  PLT 320 324 315 309 338   Recent Labs  Lab 07/17/18 2123 07/18/18 0520 07/19/18 0608 07/20/18 0535 07/21/18 0539 07/22/18 0547  NA 121* 126* 134* 136 136 136  K 4.9 4.1 3.4* 3.5 3.1* 3.4*  CL 87* 92* 103 107 106 107  CO2 20* 17* 20* 21* 23 22  GLUCOSE 121* 110* 119* 140* 117* 122*  BUN 18 23 22 17 13 8   CREATININE 1.19* 1.69* 1.58* 1.18* 0.97 0.86  CALCIUM 8.6* 8.7* 8.1* 8.1* 8.4* 8.3*  MG 1.7 1.7  --   --   --   --   PHOS 3.8 4.2  --   --   --   --     Recent Labs  Lab 07/19/18 0608 07/20/18 0535 07/21/18 0539 07/22/18 0547 07/23/18 0620  AST 60* 84* 49* 37 28  ALT 114* 82* 61* 44 33  ALKPHOS 223* 153* 142* 136* 126  BILITOT 4.2*  4.5* 2.3* 1.9* 1.2 1.1  PROT 5.8* 5.0* 5.5* 5.3* 5.5*  ALBUMIN 2.5* 2.2* 2.3* 2.1* 2.1*    Lorin Glass, MD  [facility] 07/23/2018

## 2018-07-23 NOTE — Progress Notes (Signed)
Patient was walking in hall with PT. Alert and oriented x4. Pain = 3. Medication for pain was given. Will recheck BP in 30 minutes.

## 2018-07-23 NOTE — Progress Notes (Signed)
4 Days Post-Op    CC: abdominal pain  Subjective: Patient says her abdominal pain is feeling better.  She is lying in bed with a wick in place.  She says when she has to go she has to go then and cannot wait on the nursing staff to get there.  Drainage from the JP is serosanguineous today.  Her port sites all look good she does have some wheezing.  Objective: Vital signs in last 24 hours: Temp:  [98.2 F (36.8 C)-100.6 F (38.1 C)] 98.2 F (36.8 C) (02/04 0409) Pulse Rate:  [78-98] 78 (02/04 0409) Resp:  [16-24] 16 (02/04 0409) BP: (126-150)/(63-77) 135/77 (02/04 0409) SpO2:  [90 %-99 %] 93 % (02/04 0805) Last BM Date: 07/19/18 240 PO recorded Vegetarian diet No other Intake recorded 700 urine/ drain 80 Afebrile, VSS WBC down slightly 17.1 UA yesterday was normal  CXR show some changes more like atelectasis   Intake/Output from previous day: 02/03 0701 - 02/04 0700 In: 240 [P.O.:240] Out: 780 [Urine:700; Drains:80] Intake/Output this shift: Total I/O In: 545.5 [I.V.:375.6; IV Piggyback:169.9] Out: -   General appearance: alert, cooperative and no distress Resp: still on O2, moving about 750 on IS, but not using it a lot. Some wheezing  GI: Port sites all look good.  She says it is less painful.  No BM so far.  Lab Results:  Recent Labs    07/22/18 0547 07/23/18 0620  WBC 18.4* 17.1*  HGB 8.1* 8.0*  HCT 26.9* 26.6*  PLT 309 338    BMET Recent Labs    07/21/18 0539 07/22/18 0547  NA 136 136  K 3.1* 3.4*  CL 106 107  CO2 23 22  GLUCOSE 117* 122*  BUN 13 8  CREATININE 0.97 0.86  CALCIUM 8.4* 8.3*   PT/INR No results for input(s): LABPROT, INR in the last 72 hours.  Recent Labs  Lab 07/18/18 0520 07/19/18 0608 07/20/18 0535 07/21/18 0539 07/22/18 0547  AST 140* 60* 84* 49* 37  ALT 203* 114* 82* 61* 44  ALKPHOS 308* 223* 153* 142* 136*  BILITOT 7.9* 4.2*  4.5* 2.3* 1.9* 1.2  PROT 6.4* 5.8* 5.0* 5.5* 5.3*  ALBUMIN 3.0* 2.5* 2.2* 2.3* 2.1*      Lipase     Component Value Date/Time   LIPASE 29 07/22/2018 1458     Medications: . acetaminophen  1,000 mg Oral Q8H  . DULoxetine  20 mg Oral QHS  . insulin aspart  0-9 Units Subcutaneous Q4H  . ipratropium-albuterol  3 mL Nebulization TID  . oxybutynin  5 mg Oral BID  . pantoprazole  40 mg Oral BID    Assessment/Plan Hyperlipidemia + THC Elevated troponin 0.03 x 2, <0.03 x 1 Hyponatremia125>>121>>126>>134 >>136 Prediabetes - Glucose monitoring Leukocytosis - uncertain etilology AKI -creatinine 1.06>>1.19>>1.69>>1.58 Alk phos 372>>308>>223>>136 AST 350>>140>>60>> 37 ALT 308>>203>>114>>44 Total bilirubin 8.1>>7.9>>4.5>> 1.2 Lipase144>>59>>32 Leukocytosis - WBC 18.1 Anemia - H/H 13.8/42.9 (1/31) >> 8.1/26.9>>8.0/26.6 today  Abdominal pain, nausea and vomiting Mild pancreatitis - resolving 144>>59>>32 Acute hemorrhage along the margins of the stomach/GE junction- Liver bleeding on exam in OR  Hyperbilirubinemia 1.6>>1.0- uncertain etiology Laparoscopic cholecystectomy, Snow placed over bleeding area of the GB/Liver - 07/19/2018, Dr. Stark Klein   POD # 4  FEN: IV fluids/vegitarian diet ID: Zosyn 1/29>>day 6 DVT: SCDs added, She can be on DVT prophylaxis Follow-up: To be determined   Plan: From our standpoint she is doing fairly well.  We are going to check the drain fluid for lipase  and amylase.  Today it looks mostly serous sanguinous cleaning up well.  She is in bed on a wick she says she cannot get out of bed quick enough to use the bathroom.  What she needs mostly is to be mobilized and recommend a bedside commode I would also recommend she be up for all her meals.  Continue pulmonary toilet.  Recheck her labs again tomorrow.  LOS: 6 days    Kristen Martinez 07/23/2018 [phone]

## 2018-07-23 NOTE — Evaluation (Signed)
Occupational Therapy Evaluation Patient Details Name: Kristen HarpBarbara Martinez MRN: [id number] DOB: December 01, 1940 Today's Date: 07/23/2018    History of Present Illness 78 year old female with history of dementia, type 2 diabetes, obstructive sleep apnea on CPAP who recently moved to [place] from [place] to live near to her daughter and admitted for Acute cholecystitis/cholelithiasis and s/p lap chole on 07/19/18   Clinical Impression   This 78 y/o female presents with the above. At baseline pt reports she is independent with ADL and functional mobility. Pt completing functional mobility in room using RW with minA this session. She currently requires setup assist for UB ADL, mod-maxA for LB and toileting ADLs, partly due to abdominal pain with reaching towards LEs. Pt reports she lives alone, reports has family who lives locally and can check in. She will benefit from continued acute OT services and [facility] discharge to maximize her safety and independence with ADL and mobility. Will follow.     Follow Up Recommendations  Home health OT;Supervision/Assistance - 24 hour(24hr initially)    Equipment Recommendations  3 in 1 bedside commode           Precautions / Restrictions Precautions Precautions: Fall Precaution Comments: JP drain Restrictions Weight Bearing Restrictions: No      Mobility Bed Mobility               General bed mobility comments: OOB in recliner upon arrival  Transfers Overall transfer level: Needs assistance Equipment used: Rolling walker (2 wheeled) Transfers: Sit to/from Stand Sit to Stand: Min assist         General transfer comment: increased time and effort with assist to rise and stabilize at RW, cues for hand placement    Balance Overall balance assessment: Needs assistance         Standing balance support: Bilateral upper extremity supported Standing balance-Leahy Scale: Poor Standing balance comment:  requiring UE support at this time                           ADL either performed or assessed with clinical judgement   ADL Overall ADL's : Needs assistance/impaired Eating/Feeding: Modified independent;Sitting   Grooming: Set up;Sitting   Upper Body Bathing: Min guard;Sitting   Lower Body Bathing: Moderate assistance;Sit to/from stand   Upper Body Dressing : Set up;Min guard;Sitting   Lower Body Dressing: Moderate assistance;Sit to/from stand Lower Body Dressing Details (indicate cue type and reason): minA standing balance; reports she has a sock aide at home Toilet Transfer: Minimal assistance;Ambulation;BSC;RW Toilet Transfer Details (indicate cue type and reason): BSC placed in room, approx 4' from recliner Toileting- ArchitectClothing Manipulation and Hygiene: Moderate assistance;Sit to/from stand Toileting - Clothing Manipulation Details (indicate cue type and reason): assist for gown management and peri-care after voiding bladder, reports difficulty reaching due to abdominal pain     Functional mobility during ADLs: Minimal assistance;Rolling walker General ADL Comments: pt requiring increased assist for LB ADL due to pain limiting ability to reach LEs; Pt on 2L O2 during session with sats maintaining >95%                         Pertinent Vitals/Pain Pain Assessment: Faces Faces Pain Scale: Hurts little more Pain Location: abdomen Pain Descriptors / Indicators: Discomfort;Sore Pain Intervention(s): Monitored during session;Limited activity within patient's tolerance;Repositioned     Hand Dominance     Extremity/Trunk Assessment Upper Extremity Assessment Upper  Extremity Assessment: Generalized weakness   Lower Extremity Assessment Lower Extremity Assessment: Defer to PT evaluation       Communication Communication Communication: No difficulties   Cognition Arousal/Alertness: Awake/alert Behavior During Therapy: WFL for tasks  assessed/performed Overall Cognitive Status: Within Functional Limits for tasks assessed                                     General Comments       Exercises     Shoulder Instructions      Home Living Family/patient expects to be discharged to:: Private residence Living Arrangements: Alone Available Help at Discharge: Family;Available PRN/intermittently Type of Home: Apartment       Home Layout: One level     Bathroom Shower/Tub: Chief Strategy Officer: Standard     Home Equipment: Cane - single point;Adaptive equipment Adaptive Equipment: Sock aid Additional Comments: pt reports living in a senior living community      Prior Functioning/Environment Level of Independence: Independent                 OT Problem List: Decreased activity tolerance;Decreased strength;Decreased range of motion;Impaired balance (sitting and/or standing);Obesity;Pain;Decreased knowledge of use of DME or AE      OT Treatment/Interventions: Self-care/ADL training;Therapeutic exercise;Energy conservation;DME and/or AE instruction;Therapeutic activities;Patient/family education;Balance training    OT Goals(Current goals can be found in the care plan section) Acute Rehab OT Goals Patient Stated Goal: return to independence OT Goal Formulation: With patient Time For Goal Achievement: 08/06/18 Potential to Achieve Goals: Good  OT Frequency: Min 2X/week   Barriers to D/C:            Co-evaluation              AM-PAC OT "6 Clicks" Daily Activity     Outcome Measure Help from another person eating meals?: None Help from another person taking care of personal grooming?: A Little Help from another person toileting, which includes using toliet, bedpan, or urinal?: A Lot Help from another person bathing (including washing, rinsing, drying)?: A Lot Help from another person to put on and taking off regular upper body clothing?: None Help from another person  to put on and taking off regular lower body clothing?: A Lot 6 Click Score: 17   End of Session Equipment Utilized During Treatment: Gait belt;Rolling walker;Oxygen Nurse Communication: Mobility status  Activity Tolerance: Patient tolerated treatment well;Patient limited by pain Patient left: in chair;with call bell/phone within reach  OT Visit Diagnosis: Muscle weakness (generalized) (M62.81);Unsteadiness on feet (R26.81)                Time: 1245-8099 OT Time Calculation (min): 17 min Charges:  OT General Charges $OT Visit: 1 Visit OT Evaluation $OT Eval Moderate Complexity: 1 Mod  Marcy Siren, OT [employer] Pager [phone] Office [phone]   Orlando Penner 07/23/2018, 2:07 PM

## 2018-07-23 NOTE — Progress Notes (Signed)
Physical Therapy Treatment Patient Details Name: Kristen HarpBarbara Legendre MRN: [id number] DOB: 03/12/1941 Today's Date: 07/23/2018    History of Present Illness 78 year old female with history of dementia, type 2 diabetes, obstructive sleep apnea on CPAP who recently moved to [place] from [place] to live near to her daughter and admitted for Acute cholecystitis/cholelithiasis and s/p lap chole on 07/19/18    PT Comments    Assisted out of recliner to amb a limited but functional distance.  General Gait Details: tolerated a functional distance using walker for light support due to recent ABD surgery.  Noted mild dyspnea.  RA 93%.    Follow Up Recommendations  Home health PT     Equipment Recommendations  Rolling walker with 5" wheels    Recommendations for Other Services       Precautions / Restrictions Precautions Precautions: Fall Precaution Comments: JP drain Restrictions Weight Bearing Restrictions: No    Mobility  Bed Mobility               General bed mobility comments: OOB in recliner   Transfers Overall transfer level: Needs assistance Equipment used: Rolling walker (2 wheeled) Transfers: Sit to/from Stand Sit to Stand: Supervision;Min guard         General transfer comment: increased time and effort with assist to rise and stabilize at RW, cues for hand placement  Ambulation/Gait Ambulation/Gait assistance: Supervision;Min guard Gait Distance (Feet): 125 Feet Assistive device: Rolling walker (2 wheeled) Gait Pattern/deviations: Step-through pattern;Decreased stride length;Trunk flexed Gait velocity: decreased    General Gait Details: tolerated a functional distance using walker for light support due to recent ABD surgery.  Noted mild dyspnea.  RA 93%.     Stairs             Wheelchair Mobility    Modified Rankin (Stroke Patients Only)       Balance Overall balance assessment: Needs assistance         Standing balance support: Bilateral  upper extremity supported Standing balance-Leahy Scale: Poor Standing balance comment: requiring UE support at this time                            Cognition Arousal/Alertness: Awake/alert Behavior During Therapy: WFL for tasks assessed/performed Overall Cognitive Status: Within Functional Limits for tasks assessed                                        Exercises      General Comments        Pertinent Vitals/Pain Pain Assessment: Faces Faces Pain Scale: Hurts a little bit Pain Location: abdomen Pain Descriptors / Indicators: Discomfort;Sore Pain Intervention(s): Monitored during session    Home Living Family/patient expects to be discharged to:: Private residence Living Arrangements: Alone Available Help at Discharge: Family;Available PRN/intermittently Type of Home: Apartment     Home Layout: One level Home Equipment: Cane - single point;Adaptive equipment Additional Comments: pt reports living in a senior living community    Prior Function Level of Independence: Independent          PT Goals (current goals can now be found in the care plan section) Acute Rehab PT Goals Patient Stated Goal: return to independence Progress towards PT goals: Progressing toward goals    Frequency    Min 3X/week      PT Plan Current plan remains appropriate  Co-evaluation              AM-PAC PT "6 Clicks" Mobility   Outcome Measure  Help needed turning from your back to your side while in a flat bed without using bedrails?: A Little Help needed moving from lying on your back to sitting on the side of a flat bed without using bedrails?: A Little Help needed moving to and from a bed to a chair (including a wheelchair)?: A Little Help needed standing up from a chair using your arms (e.g., wheelchair or bedside chair)?: A Little Help needed to walk in hospital room?: A Little Help needed climbing 3-5 steps with a railing? : A Lot 6 Click  Score: 17    End of Session Equipment Utilized During Treatment: Gait belt Activity Tolerance: Patient tolerated treatment well Patient left: in chair;with call bell/phone within reach;with chair alarm set Nurse Communication: Mobility status PT Visit Diagnosis: Difficulty in walking, not elsewhere classified (R26.2)     Time: [id number] PT Time Calculation (min) (ACUTE ONLY): 17 min  Charges:  $Gait Training: 8-22 mins                     Felecia ShellingLori Jesica Goheen  PTA Acute  Rehabilitation Services Pager      [phone] Office      [phone]

## 2018-07-24 LAB — CBC WITH DIFFERENTIAL/PLATELET
Abs Immature Granulocytes: 0.78 10*3/uL — ABNORMAL HIGH (ref 0.00–0.07)
Basophils Absolute: 0 10*3/uL (ref 0.0–0.1)
Basophils Relative: 0 %
Eosinophils Absolute: 0.3 10*3/uL (ref 0.0–0.5)
Eosinophils Relative: 3 %
HCT: 27.2 % — ABNORMAL LOW (ref 36.0–46.0)
Hemoglobin: 8.1 g/dL — ABNORMAL LOW (ref 12.0–15.0)
Immature Granulocytes: 6 %
Lymphocytes Relative: 6 %
Lymphs Abs: 0.7 10*3/uL (ref 0.7–4.0)
MCH: 27.1 pg (ref 26.0–34.0)
MCHC: 29.8 g/dL — ABNORMAL LOW (ref 30.0–36.0)
MCV: 91 fL (ref 80.0–100.0)
Monocytes Absolute: 1 10*3/uL (ref 0.1–1.0)
Monocytes Relative: 8 %
Neutro Abs: 9.9 10*3/uL — ABNORMAL HIGH (ref 1.7–7.7)
Neutrophils Relative %: 77 %
Platelets: 417 10*3/uL — ABNORMAL HIGH (ref 150–400)
RBC: 2.99 MIL/uL — ABNORMAL LOW (ref 3.87–5.11)
RDW: 16.3 % — ABNORMAL HIGH (ref 11.5–15.5)
WBC: 12.8 10*3/uL — ABNORMAL HIGH (ref 4.0–10.5)
nRBC: 0.2 % (ref 0.0–0.2)

## 2018-07-24 LAB — HEPATIC FUNCTION PANEL
ALT: 29 U/L (ref 0–44)
AST: 31 U/L (ref 15–41)
Albumin: 2.1 g/dL — ABNORMAL LOW (ref 3.5–5.0)
Alkaline Phosphatase: 138 U/L — ABNORMAL HIGH (ref 38–126)
Bilirubin, Direct: 0.5 mg/dL — ABNORMAL HIGH (ref 0.0–0.2)
Indirect Bilirubin: 0.4 mg/dL (ref 0.3–0.9)
Total Bilirubin: 0.9 mg/dL (ref 0.3–1.2)
Total Protein: 5.5 g/dL — ABNORMAL LOW (ref 6.5–8.1)

## 2018-07-24 LAB — BASIC METABOLIC PANEL
Anion gap: 7 (ref 5–15)
BUN: 7 mg/dL — ABNORMAL LOW (ref 8–23)
CO2: 26 mmol/L (ref 22–32)
Calcium: 8.5 mg/dL — ABNORMAL LOW (ref 8.9–10.3)
Chloride: 104 mmol/L (ref 98–111)
Creatinine, Ser: 0.93 mg/dL (ref 0.44–1.00)
GFR calc Af Amer: >60 mL/min (ref >60)
GFR calc non Af Amer: >60 mL/min (ref >60)
Glucose, Bld: 122 mg/dL — ABNORMAL HIGH (ref 70–99)
Potassium: 3.9 mmol/L (ref 3.5–5.1)
Sodium: 137 mmol/L (ref 135–145)

## 2018-07-24 LAB — GLUCOSE, CAPILLARY
Glucose-Capillary: 94 mg/dL (ref 70–99)
Glucose-Capillary: 102 mg/dL — ABNORMAL HIGH (ref 70–99)
Glucose-Capillary: 98 mg/dL (ref 70–99)
Glucose-Capillary: 90 mg/dL (ref 70–99)
Glucose-Capillary: 138 mg/dL — ABNORMAL HIGH (ref 70–99)
Glucose-Capillary: 93 mg/dL (ref 70–99)

## 2018-07-24 LAB — MAGNESIUM: Magnesium: 1.8 mg/dL (ref 1.7–2.4)

## 2018-07-24 MED ORDER — IPRATROPIUM-ALBUTEROL 0.5-2.5 (3) MG/3ML IN SOLN
3.0000 mL | RESPIRATORY_TRACT | Status: DC | PRN
  Administered 2018-07-26 – 2018-07-29 (×2): 3 mL via RESPIRATORY_TRACT
  Filled 2018-07-24 (×2): qty 3

## 2018-07-24 MED ORDER — IPRATROPIUM-ALBUTEROL 0.5-2.5 (3) MG/3ML IN SOLN
3.0000 mL | Freq: Four times a day (QID) | RESPIRATORY_TRACT | Status: DC
  Administered 2018-07-25 – 2018-07-26 (×7): 3 mL via RESPIRATORY_TRACT
  Filled 2018-07-24 (×8): qty 3

## 2018-07-24 MED ORDER — INSULIN ASPART 100 UNIT/ML ~~LOC~~ SOLN
0.0000 [IU] | Freq: Three times a day (TID) | SUBCUTANEOUS | Status: DC
  Administered 2018-07-24 – 2018-07-28 (×5): 1 [IU] via SUBCUTANEOUS
  Administered 2018-07-28 – 2018-07-29 (×2): 2 [IU] via SUBCUTANEOUS
  Administered 2018-07-29: 1 [IU] via SUBCUTANEOUS

## 2018-07-24 NOTE — Discharge Instructions (Addendum)
[facility] ° °Please arrive at least 30 min before your appointment to complete your check in paperwork.  If you are unable to arrive 30 min prior to your appointment time we may have to cancel or reschedule you. °LAPAROSCOPIC SURGERY: POST OP INSTRUCTIONS °Always review your discharge instruction sheet given to you by the facility where your surgery was performed. °IF YOU HAVE DISABILITY OR FAMILY LEAVE FORMS, YOU MUST BRING THEM TO THE OFFICE FOR PROCESSING.   °DO NOT GIVE THEM TO YOUR DOCTOR. ° °PAIN CONTROL ° °1. First take acetaminophen (Tylenol) AND/or ibuprofen (Advil) to control your pain after surgery.  Follow directions on package.  Taking acetaminophen (Tylenol) and/or ibuprofen (Advil) regularly after surgery will help to control your pain and lower the amount of prescription pain medication you may need.  You should not take more than 4,000 mg (4 grams) of acetaminophen (Tylenol) in 24 hours.  You should not take ibuprofen (Advil), aleve, motrin, naprosyn or other NSAIDS if you have a history of stomach ulcers or chronic kidney disease.  °2. A prescription for pain medication may be given to you upon discharge.  Take your pain medication as prescribed, if you still have uncontrolled pain after taking acetaminophen (Tylenol) or ibuprofen (Advil). °3. Use ice packs to help control pain. °4. If you need a refill on your pain medication, please contact your pharmacy.  They will contact our office to request authorization. Prescriptions will not be filled after 5pm or on week-ends. ° °HOME MEDICATIONS °5. Take your usually prescribed medications unless otherwise directed. ° °DIET °6. You should follow a light diet the first few days after arrival home.  Be sure to include lots of fluids daily. Avoid fatty, fried foods.  ° °CONSTIPATION °7. It is common to experience some constipation after surgery and if you are taking pain medication.  Increasing fluid intake and taking a stool  softener (such as Colace) will usually help or prevent this problem from occurring.  A mild laxative (Milk of Magnesia or Miralax) should be taken according to package instructions if there are no bowel movements after 48 hours. ° °WOUND/INCISION CARE °8. Most patients will experience some swelling and bruising in the area of the incisions.  Ice packs will help.  Swelling and bruising can take several days to resolve.  °9. Unless discharge instructions indicate otherwise, follow guidelines below  °a. STERI-STRIPS - you may remove your outer bandages 48 hours after surgery, and you may shower at that time.  You have steri-strips (small skin tapes) in place directly over the incision.  These strips should be left on the skin for 7-10 days.   °b. DERMABOND/SKIN GLUE - you may shower in 24 hours.  The glue will flake off over the next 2-3 weeks. °10. Any sutures or staples will be removed at the office during your follow-up visit. ° °ACTIVITIES °11. You may resume regular (light) daily activities beginning the next day--such as daily self-care, walking, climbing stairs--gradually increasing activities as tolerated.  You may have sexual intercourse when it is comfortable.  Refrain from any heavy lifting or straining until approved by your doctor. °a. You may drive when you are no longer taking prescription pain medication, you can comfortably wear a seatbelt, and you can safely maneuver your car and apply brakes. ° °FOLLOW-UP °12. You should see your doctor in the office for a follow-up appointment approximately 2-3 weeks after your surgery.  You should have been given your post-op/follow-up appointment when   your surgery was scheduled.  If you did not receive a post-op/follow-up appointment, make sure that you call for this appointment within a day or two after you arrive home to insure a convenient appointment time.  OTHER INSTRUCTIONS  WHEN TO CALL YOUR DOCTOR: 1. Fever over 101.0 2. Inability to  urinate 3. Continued bleeding from incision. 4. Increased pain, redness, or drainage from the incision. 5. Increasing abdominal pain  The clinic staff is available to answer your questions during regular business hours.  Please don't hesitate to call and ask to speak to one of the nurses for clinical concerns.  If you have a medical emergency, go to the nearest emergency room or call 911.  A surgeon from [facility] is always on call at the hospital. [address], [address], [place], [place]  [address] ? [address] [address], [place], [place]   [address] [phone] ? [phone] ? FAX [phone]   Additional discharge instructions  Please get your medications reviewed and adjusted by your Primary MD.  Please request your Primary MD to go over all Hospital Tests and Procedure/Radiological results at the follow up, please get all Hospital records sent to your Prim MD by signing hospital release before you go home.  If you had Pneumonia of Lung problems at the Hospital: Please get a 2 view Chest X ray done in 6-8 weeks after hospital discharge or sooner if instructed by your Primary MD.  If you have Congestive Heart Failure: Please call your Cardiologist or Primary MD anytime you have any of the following symptoms:  1) 3 pound weight gain in 24 hours or 5 pounds in 1 week  2) shortness of breath, with or without a dry hacking cough  3) swelling in the hands, feet or stomach  4) if you have to sleep on extra pillows at night in order to breathe  Follow cardiac low salt diet and 1.5 lit/day fluid restriction.  If you have diabetes Accuchecks 4 times/day, Once in AM empty stomach and then before each meal. Log in all results and show them to your primary doctor at your next visit. If any glucose reading is under 80 or above 300 call your primary MD immediately.  If you have Seizure/Convulsions/Epilepsy: Please do not drive, operate heavy machinery, participate in  activities at heights or participate in high speed sports until you have seen by Primary MD or a Neurologist and advised to do so again.  If you had Gastrointestinal Bleeding: Please ask your Primary MD to check a complete blood count within one week of discharge or at your next visit. Your endoscopic/colonoscopic biopsies that are pending at the time of discharge, will also need to followed by your Primary MD.  Get Medicines reviewed and adjusted. Please take all your medications with you for your next visit with your Primary MD  Please request your Primary MD to go over all hospital tests and procedure/radiological results at the follow up, please ask your Primary MD to get all Hospital records sent to his/her office.  If you experience worsening of your admission symptoms, develop shortness of breath, life threatening emergency, suicidal or homicidal thoughts you must seek medical attention immediately by calling 911 or calling your MD immediately  if symptoms less severe.  You must read complete instructions/literature along with all the possible adverse reactions/side effects for all the Medicines you take and that have been prescribed to you. Take any new Medicines after you have completely understood and accpet   all the possible adverse reactions/side effects.   Do not drive or operate heavy machinery when taking Pain medications.   Do not take more than prescribed Pain, Sleep and Anxiety Medications  Special Instructions: If you have smoked or chewed Tobacco  in the last 2 yrs please stop smoking, stop any regular Alcohol  and or any Recreational drug use.  Wear Seat belts while driving.  Please note You were cared for by a hospitalist during your hospital stay. If you have any questions about your discharge medications or the care you received while you were in the hospital after you are discharged, you can call the unit and asked to speak with the hospitalist on call if the  hospitalist that took care of you is not available. Once you are discharged, your primary care physician will handle any further medical issues. Please note that NO REFILLS for any discharge medications will be authorized once you are discharged, as it is imperative that you return to your primary care physician (or establish a relationship with a primary care physician if you do not have one) for your aftercare needs so that they can reassess your need for medications and monitor your lab values.  You can reach the hospitalist office at phone [phone] or fax [phone]   If you do not have a primary care physician, you can call [phone] for a physician referral.   

## 2018-07-24 NOTE — Progress Notes (Signed)
Occupational Therapy Treatment Patient Details Name: Kristen Martinez MRN: [id number] DOB: March 30, 1941 Today's Date: 07/24/2018    History of present illness 78 year old female with history of dementia, type 2 diabetes, obstructive sleep apnea on CPAP who recently moved to [place] from [place] to live near to her daughter and admitted for Acute cholecystitis/cholelithiasis and s/p lap chole on 07/19/18   OT comments  Used commode and educated on AE for adls.  Pt was easily distracted today. She thought she felt wet, but did not call for assistance; purewick was not turned on.    Follow Up Recommendations  Home health OT;Supervision/Assistance - 24 hour    Equipment Recommendations  3 in 1 bedside commode    Recommendations for Other Services      Precautions / Restrictions Precautions Precautions: Fall Precaution Comments: JP drain Restrictions Weight Bearing Restrictions: No       Mobility Bed Mobility         Supine to sit: Min guard     General bed mobility comments: use of bedrail  Transfers   Equipment used: Rolling walker (2 wheeled)   Sit to Stand: Min guard         General transfer comment: management of lines, cues for safety as gown had been under her and was in the way when she stood    Balance             Standing balance-Leahy Scale: Poor                             ADL either performed or assessed with clinical judgement   ADL                           Toilet Transfer: Minimal assistance;Ambulation;BSC;RW   Toileting- Clothing Manipulation and Hygiene: Moderate assistance;Sit to/from stand         General ADL Comments: pt was in bed saturated with urine: purewick was hooked up but not turned on. Assisted to commode, with hygiene and to chair.  Educated on AE for adls as she cannot reach below knees nor around the back of her for adls/hygiene. Pt was unfamiliar with toilet aide, but she had other AE when she lived  in [facility]     Vision       Perception     Praxis      Cognition Arousal/Alertness: Awake/alert Behavior During Therapy: [facility] for tasks assessed/performed                                   General Comments: decreased memory--distracted herself        Exercises     Shoulder Instructions       General Comments pt has 1 daughter who is in the hospital right now.  2 are in area    Pertinent Vitals/ Pain       Pain Assessment: Faces Pain Score: 2  Faces Pain Scale: Hurts a little bit Pain Location: abdomen Pain Descriptors / Indicators: Discomfort;Sore Pain Intervention(s): Limited activity within patient's tolerance;Monitored during session;Repositioned  Home Living                                          Prior Functioning/Environment  Frequency  Min 2X/week        Progress Toward Goals  OT Goals(current goals can now be found in the care plan section)  Progress towards OT goals: Progressing toward goals     Plan      Co-evaluation                 AM-PAC OT "6 Clicks" Daily Activity     Outcome Measure   Help from another person eating meals?: None Help from another person taking care of personal grooming?: A Little Help from another person toileting, which includes using toliet, bedpan, or urinal?: A Lot Help from another person bathing (including washing, rinsing, drying)?: A Lot Help from another person to put on and taking off regular upper body clothing?: A Little Help from another person to put on and taking off regular lower body clothing?: A Lot 6 Click Score: 16    End of Session    OT Visit Diagnosis: Muscle weakness (generalized) (M62.81);Unsteadiness on feet (R26.81)   Activity Tolerance Patient tolerated treatment well;Patient limited by pain   Patient Left in chair;with call bell/phone within reach   Nurse Communication          Time: 4098-11911213-1246 OT Time Calculation (min):  33 min  Charges: OT General Charges $OT Visit: 1 Visit OT Treatments $Self Care/Home Management : 23-37 mins  Marica OtterMaryellen Denny Lave, OTR/L Acute Rehabilitation Services [phone] [facility] pager [phone] office 07/24/2018   Marleigh Kaylor 07/24/2018, 12:56 PM

## 2018-07-24 NOTE — Progress Notes (Signed)
PROGRESS NOTE  Kristen Martinez ZOX:[id number] DOB: [birth year] DOA: 07/17/2018 PCP: Patient, No Pcp Per   LOS: 7 days   Brief narrative: Patient is a[age] year old female with history of dementia, type 2 diabetes, obstructive sleep apnea on CPAP who recently moved to [place] from [place] to live near to her daughter presented to the ED on 07/17/2018 with 2 days history of abdominal pain, nausea and multiple episodes of vomiting.  In the ED,patientwas found with elevated liver enzymes including elevated bilirubin, gallbladder stone with pericholecystic fluid as well as abnormal GE junction.  Surgery and GI were consulted  MRCP 1/30 did not show any evidence of biliary dilation but showed findings suggestive of cholecystitis. Patient most likely had CBD stone which she already passed,as evidenced by decreasing bilirubin level.Patient underwent laparoscopic cholecystectomyon 07/19/2018.  Per surgery note, patient had a gangrenous gallbladder with a large gallbladder stone which caused the gallbladder to press over the common bile duct causing obstructive jaundice (Mirrizzi syndrome).  Subjective: Reports mild headache and anxiety.  Denies abdominal pain.  Tolerating diet.  Had BM 2/4.  Not on home oxygen.  At times wheezing.  Assessment/Plan:  Principal Problem:   Acute calculous cholecystitis s/p lap cholecystectomy 07/19/2018 Active Problems:   Liver disease   Hyponatremia   Prolonged QT interval   OSA on CPAP   Pericardial effusion   Dementia (HCC)   Ascites   Elevated LFTs   Obesity (BMI 30-39.9)  Acute cholecystitis/cholelithiasis- status post lap chole. Currently on IV Zosyn.  Pain controlled.  [facility] follow-up appreciated and state that she does not appear to have a bile leak, drainage appears to be old bloody fluid and recommend stopping antibiotics.  Drain removed 2/5.  Elevated liver enzymes- After cholecystectomy, liver enzymes have almost normalized.  Acute  intra-abdominal hemorrhage-MRCP 07/18/2018 showed an acute hemorrhage along the stomach tracks partially adjacent to the spleen, down to the left adrenal gland and to the left perirenalspace. Unclear etiology.  No evidence of perforation. No evidence of splenic artery aneurysm. Hemoglobin has been stable in the low 8 g range for the last 3 days.  [facility] follow-up appreciated: Jamelle HaringSnow placed over bleeding area of GB/liver 07/19/2018.  Anemia: Stable.  No overt bleeding.  Hypokalemia: Replaced.  Magnesium normal.  Low-grade temperature: Blood cultures x2: Negative.  Chest x-ray 2/3 showed left basilar airspace disease, suspect atelectasis rather than pneumonia given her symptomatology.  Leukocytosis is improved to 12.8.  Except low-grade temperature spike of 100.6 on 2/3, has essentially been afebrile.  Discontinue Zosyn and monitor.  Hyponatremia:Sodium level was low at 121 on admission.Suspect secondary to dehydration. Resolved.  Acute kidney injury:Creatinine peaked at 1.69.  Resolved.  Obstructive sleep apnea:On CPAP at night. Refuses to use it in the hospital.  Depression-continue Cymbalta  Type II DM: Continue SSI.  Monitor CBGs.  A1c 6.3 on 1/30.   DVT prophylaxis:SCDs Code Status:Full Family Communication:None at bedside today. Disposition Plan:DC home pending clinical improvement, hopefully in the next day or 2.  Consultants:  GI  Surgery  Procedures:  Lap cholecystectomy on 07/19/2018  Antimicrobials:  Anti-infectives (From admission, onward)   Start     Dose/Rate Route Frequency Ordered Stop   07/18/18 0600  piperacillin-tazobactam (ZOSYN) IVPB 3.375 g     3.375 g 12.5 mL/hr over 240 Minutes Intravenous Every 8 hours 07/17/18 2008     07/17/18 2030  piperacillin-tazobactam (ZOSYN) IVPB 3.375 g     3.375 g 100 mL/hr over 30 Minutes Intravenous  Once 07/17/18 2006 07/17/18 2242  07/17/18 1830  ciprofloxacin (CIPRO) IVPB 400 mg     400 mg 200  mL/hr over 60 Minutes Intravenous  Once 07/17/18 1822 07/17/18 2154      Infusions:  . sodium chloride 50 mL/hr at 07/24/18 1400  . piperacillin-tazobactam (ZOSYN)  IV 3.375 g (07/24/18 1322)    Scheduled Meds: . acetaminophen  1,000 mg Oral Q8H  . bisacodyl  10 mg Rectal Daily  . DULoxetine  20 mg Oral QHS  . insulin aspart  0-9 Units Subcutaneous Q4H  . ipratropium-albuterol  3 mL Nebulization TID  . oxybutynin  5 mg Oral BID  . pantoprazole  40 mg Oral BID  . polyethylene glycol  17 g Oral BID    PRN meds: bisacodyl, HYDROmorphone, ibuprofen, ipratropium-albuterol, ondansetron (ZOFRAN) IV, oxyCODONE, traMADol   Objective: Vitals:   07/24/18 1443 07/24/18 1600  BP: (!) 143/76 138/80  Pulse: 80   Resp:    Temp: 98.9 F (37.2 C)   SpO2: 100%    Physical Exam: GENERAL: Pleasant elderly female, moderately built and obese sitting up comfortably in chair this morning. CHEST: Slightly harsh breath sounds with occasional bilateral rhonchi but no crackles.  No increased work of breathing. CVS: S1 and S2 heard, RRR.  No JVD, murmurs or pedal edema. ABDOMEN: Soft, obese but not distended.  Right JP drain site intact.  Nontender.  Normal bowel sounds heard. EXTREMITIES: No edema.  Symmetric 5 x 5 power. CNS: Alert, awake, oriented x3.  No focal neurological deficits. SKIN: warm and dry without rashes.  Data Review: I have personally reviewed the laboratory data and studies available.  Recent Labs  Lab 07/19/18 [phone] 07/20/18 0535 07/21/18 0539 07/22/18 0547 07/23/18 0620 07/24/18 0627  WBC 18.4* 16.0* 16.1* 18.4* 17.1* 12.8*  NEUTROABS 15.6* 13.6* 11.1* 11.6*  --  9.9*  HGB 11.8* 8.7* 8.5* 8.1* 8.0* 8.1*  HCT 37.7 27.7* 27.2* 26.9* 26.6* 27.2*  MCV 89.1 87.9 88.6 90.6 91.7 91.0  PLT 320 324 315 309 338 417*   Recent Labs  Lab 07/17/18 2123 07/18/18 0520 07/19/18 0608 07/20/18 0535 07/21/18 0539 07/22/18 0547 07/24/18 0627  NA 121* 126* 134* 136 136 136 137  K  4.9 4.1 3.4* 3.5 3.1* 3.4* 3.9  CL 87* 92* 103 107 106 107 104  CO2 20* 17* 20* 21* 23 22 26   GLUCOSE 121* 110* 119* 140* 117* 122* 122*  BUN 18 23 22 17 13 8  7*  CREATININE 1.19* 1.69* 1.58* 1.18* 0.97 0.86 0.93  CALCIUM 8.6* 8.7* 8.1* 8.1* 8.4* 8.3* 8.5*  MG 1.7 1.7  --   --   --   --  1.8  PHOS 3.8 4.2  --   --   --   --   --     Recent Labs  Lab 07/20/18 0535 07/21/18 0539 07/22/18 0547 07/23/18 0620 07/24/18 0627  AST 84* 49* 37 28 31  ALT 82* 61* 44 33 29  ALKPHOS 153* 142* 136* 126 138*  BILITOT 2.3* 1.9* 1.2 1.1 0.9  PROT 5.0* 5.5* 5.3* 5.5* 5.5*  ALBUMIN 2.2* 2.3* 2.1* 2.1* 2.1*    Marcellus Scott, MD, [place], [facility]. [facility]  To contact the attending provider between 7A-7P or the covering provider during after hours 7P-7A, please log into the web site [email or web address] and access using universal  password for that web site. If you do not have the password, please call the hospital operator.

## 2018-07-24 NOTE — Progress Notes (Signed)
5 Days Post-Op    CC: Abdominal pain  Subjective: Tolerating diet, port sites all look good.  Drainage from the [place] is still old bloody looking drainage.  Amylase done yesterday on the fluid was 10.  Objective: Vital signs in last 24 hours: Temp:  [98.2 F (36.8 C)-99.1 F (37.3 C)] 98.2 F (36.8 C) (02/04 1947) Pulse Rate:  [46-95] 46 (02/05 0531) Resp:  [18-19] 19 (02/05 0531) BP: (128-151)/(59-82) 147/80 (02/05 0531) SpO2:  [97 %-100 %] 97 % (02/05 0643) Last BM Date: 07/23/18 1080 p.o. vegetarian diet 800 IV 1550 urine BM x3 Pain: Tylenol 1 g x 3 OxyIR 50 mg yesterday Afebrile vital signs are stable WBC down to 12.8,  H/H -8.1/27 -stable CMP okay Drain fluid: amylase 10 yesterday Intake/Output from previous day: 02/04 0701 - 02/05 0700 In: 1801.9 [P.O.:1080; I.V.:453.6; IV Piggyback:268.2] Out: 1610 [Urine:1550; Drains:60] Intake/Output this shift: No intake/output data recorded.  General appearance: alert, cooperative and no distress Resp: Some wheezing. GI: Large abdomen, she is sitting up in the chair.  Does not seem to have much discomfort from it.  Port sites all look good.  She is tolerating soft diet, positive BM yesterday.  Drainage is still a thick, bloody appearing fluid.  Lab Results:  Recent Labs    07/23/18 0620 07/24/18 0627  WBC 17.1* 12.8*  HGB 8.0* 8.1*  HCT 26.6* 27.2*  PLT 338 417*    BMET Recent Labs    07/22/18 0547 07/24/18 0627  NA 136 137  K 3.4* 3.9  CL 107 104  CO2 22 26  GLUCOSE 122* 122*  BUN 8 7*  CREATININE 0.86 0.93  CALCIUM 8.3* 8.5*   PT/INR No results for input(s): LABPROT, INR in the last 72 hours.  Recent Labs  Lab 07/20/18 0535 07/21/18 0539 07/22/18 0547 07/23/18 0620 07/24/18 0627  AST 84* 49* 37 28 31  ALT 82* 61* 44 33 29  ALKPHOS 153* 142* 136* 126 138*  BILITOT 2.3* 1.9* 1.2 1.1 0.9  PROT 5.0* 5.5* 5.3* 5.5* 5.5*  ALBUMIN 2.2* 2.3* 2.1* 2.1* 2.1*     Lipase     Component Value Date/Time    LIPASE 29 07/22/2018 1458     Medications: . acetaminophen  1,000 mg Oral Q8H  . bisacodyl  10 mg Rectal Daily  . DULoxetine  20 mg Oral QHS  . insulin aspart  0-9 Units Subcutaneous Q4H  . ipratropium-albuterol  3 mL Nebulization TID  . oxybutynin  5 mg Oral BID  . pantoprazole  40 mg Oral BID  . polyethylene glycol  17 g Oral BID    Assessment/Plan  Hyperlipidemia + THC Elevated troponin 0.03 x 2, <0.03 x 1 Hyponatremia125>>121>>126>>134>>136 Prediabetes - Glucose monitoring Leukocytosis - uncertain etilology AKI -creatinine 1.06>>1.19>>1.69>>1.58 Alk phos 372>>308>>223>>136 AST 350>>140>>60>> 37 ALT 308>>203>>114>>44 Total bilirubin 8.1>>7.9>>4.5>> 1.2 Lipase144>>59>>32 Leukocytosis - WBC 18.1 Anemia - H/H 13.8/42.9 (1/31) >> 8.1/26.9>>8.0/26.6 >> 8.1/27.2.  Abdominal pain, nausea and vomiting Mild pancreatitis - resolving 144>>59>>32 Acute hemorrhage along the margins of the stomach/GE junction-Liver bleeding on exam in OR Hyperbilirubinemia 1.7>>7.9- uncertain etiology Laparoscopic cholecystectomy, Snow placed over bleeding area of the GB/Liver -07/19/2018, Dr. Stark Klein  POD # 5  FEN: IV fluids/vegitarian diet ID: Zosyn 1/29>>day7 DVT: SCDs added, She can be on DVT prophylaxis Follow-up: To be determined  Plan: From our standpoint I think we can stop the antibiotics.  She does not appear to have a bile leak.  Drainage appears to be old bloody fluid.  Globin hematocrit appear stable.  She is afebrile and WBC is improving.  Still seems to have some pulmonary issues back on oxygen.  Some wheezing this a.m.  I will check on pulling her drain later today.  LOS: 7 days    Kristen Martinez 07/24/2018 [phone]

## 2018-07-25 ENCOUNTER — Inpatient Hospital Stay (HOSPITAL_COMMUNITY): Payer: Medicare Other

## 2018-07-25 LAB — CBC WITH DIFFERENTIAL/PLATELET
Abs Immature Granulocytes: 0.24 10*3/uL — ABNORMAL HIGH (ref 0.00–0.07)
Basophils Absolute: 0 10*3/uL (ref 0.0–0.1)
Basophils Relative: 0 %
Eosinophils Absolute: 0.2 10*3/uL (ref 0.0–0.5)
Eosinophils Relative: 2 %
HCT: 26.7 % — ABNORMAL LOW (ref 36.0–46.0)
Hemoglobin: 8 g/dL — ABNORMAL LOW (ref 12.0–15.0)
Immature Granulocytes: 2 %
Lymphocytes Relative: 10 %
Lymphs Abs: 1.4 10*3/uL (ref 0.7–4.0)
MCH: 27.3 pg (ref 26.0–34.0)
MCHC: 30 g/dL (ref 30.0–36.0)
MCV: 91.1 fL (ref 80.0–100.0)
Monocytes Absolute: 1 10*3/uL (ref 0.1–1.0)
Monocytes Relative: 8 %
Neutro Abs: 10.5 10*3/uL — ABNORMAL HIGH (ref 1.7–7.7)
Neutrophils Relative %: 78 %
Platelets: 428 10*3/uL — ABNORMAL HIGH (ref 150–400)
RBC: 2.93 MIL/uL — ABNORMAL LOW (ref 3.87–5.11)
RDW: 16.4 % — ABNORMAL HIGH (ref 11.5–15.5)
WBC: 13.4 10*3/uL — ABNORMAL HIGH (ref 4.0–10.5)
nRBC: 0 % (ref 0.0–0.2)

## 2018-07-25 LAB — GLUCOSE, CAPILLARY
Glucose-Capillary: 84 mg/dL (ref 70–99)
Glucose-Capillary: 110 mg/dL — ABNORMAL HIGH (ref 70–99)
Glucose-Capillary: 134 mg/dL — ABNORMAL HIGH (ref 70–99)
Glucose-Capillary: 207 mg/dL — ABNORMAL HIGH (ref 70–99)

## 2018-07-25 MED ORDER — METHYLPREDNISOLONE SODIUM SUCC 40 MG IJ SOLR
40.0000 mg | Freq: Every day | INTRAMUSCULAR | Status: DC
  Administered 2018-07-25 – 2018-07-26 (×2): 40 mg via INTRAVENOUS
  Filled 2018-07-25 (×2): qty 1

## 2018-07-25 MED ORDER — PHENOL 1.4 % MT LIQD
1.0000 | OROMUCOSAL | Status: DC | PRN
  Administered 2018-07-25: 1 via OROMUCOSAL
  Filled 2018-07-25: qty 177

## 2018-07-25 MED ORDER — FUROSEMIDE 20 MG PO TABS
20.0000 mg | ORAL_TABLET | Freq: Once | ORAL | Status: AC
  Administered 2018-07-25: 20 mg via ORAL
  Filled 2018-07-25: qty 1

## 2018-07-25 NOTE — Progress Notes (Signed)
Physical Therapy Treatment Patient Details Name: Kristen HarpBarbara Martinez MRN: [id number] DOB: 1940/10/29 Today's Date: 07/25/2018    History of Present Illness 78 year old female with history of dementia, type 2 diabetes, obstructive sleep apnea on CPAP who recently moved to [place] from [place] to live near to her daughter and admitted for Acute cholecystitis/cholelithiasis and s/p lap chole on 07/19/18    PT Comments    Pt continues to participate well. O: 98% 2L at rest; 94% 2L [place] O2.    Follow Up Recommendations  Home health PT     Equipment Recommendations  Rolling walker with 5" wheels    Recommendations for Other Services       Precautions / Restrictions Precautions Precautions: Fall Precaution Comments: monitor O2  Restrictions Weight Bearing Restrictions: No    Mobility  Bed Mobility Overal bed mobility: Needs Assistance Bed Mobility: Supine to Sit;Sit to Supine     Supine to sit: Supervision;HOB elevated Sit to supine: Supervision;HOB elevated   General bed mobility comments: for safety.   Transfers Overall transfer level: Needs assistance Equipment used: 4-wheeled walker Transfers: Sit to/from Stand Sit to Stand: Min guard         General transfer comment: close guard for safety. VCs safety, hand placement, proper operation of rollator  Ambulation/Gait Ambulation/Gait assistance: Min guard Gait Distance (Feet): 150 Feet Assistive device: 4-wheeled walker Gait Pattern/deviations: Step-through pattern;Decreased stride length     General Gait Details: close guard for safety. noted swerving in/out with rollator use. No LOB. 90% on RA after walk.   Stairs             Wheelchair Mobility    Modified Rankin (Stroke Patients Only)       Balance                                            Cognition Arousal/Alertness: Awake/alert Behavior During Therapy: WFL for tasks assessed/performed Overall Cognitive Status: Within  Functional Limits for tasks assessed                                 General Comments: decreased memory--distracted herself      Exercises      General Comments        Pertinent Vitals/Pain Pain Assessment: No/denies pain    Home Living                      Prior Function            PT Goals (current goals can now be found in the care plan section) Progress towards PT goals: Progressing toward goals    Frequency    Min 3X/week      PT Plan Current plan remains appropriate    Co-evaluation              AM-PAC PT "6 Clicks" Mobility   Outcome Measure  Help needed turning from your back to your side while in a flat bed without using bedrails?: A Little Help needed moving from lying on your back to sitting on the side of a flat bed without using bedrails?: A Little Help needed moving to and from a bed to a chair (including a wheelchair)?: A Little Help needed standing up from a chair using your arms (e.g., wheelchair or bedside chair)?: A  Little Help needed to walk in hospital room?: A Little Help needed climbing 3-5 steps with a railing? : A Little 6 Click Score: 18    End of Session Equipment Utilized During Treatment: Gait belt;Oxygen Activity Tolerance: Patient tolerated treatment well Patient left: in bed;with call bell/phone within reach;with bed alarm set   PT Visit Diagnosis: Difficulty in walking, not elsewhere classified (R26.2)     Time: 7616-0737 PT Time Calculation (min) (ACUTE ONLY): 14 min  Charges:  $Gait Training: 8-22 mins                        Rebeca Alert, PT Acute Rehabilitation Services Pager: [phone] Office: [phone]

## 2018-07-25 NOTE — Care Management Important Message (Signed)
Important Message  Patient Details  Name: Cashlynn Weisberg MRN: [id number] Date of Birth: 01-Mar-1941   Medicare Important Message Given:  Yes    Caren Macadam 07/25/2018, 11:42 AMImportant Message  Patient Details  Name: Quadirah Holden MRN: [id number] Date of Birth: Jul 30, 1940   Medicare Important Message Given:  Yes    Caren Macadam 07/25/2018, 11:42 AM

## 2018-07-25 NOTE — Progress Notes (Signed)
6 Days Post-Op    CC: Abdominal pain  Subjective: Patient sitting up in bed eating breakfast.  Abdominal sites all look good.  The drain is out.  Her biggest issue is respiratory she is wheezing and still on oxygen.  Objective: Vital signs in last 24 hours: Temp:  [98.1 F (36.7 C)-98.9 F (37.2 C)] 98.1 F (36.7 C) (02/06 0541) Pulse Rate:  [61-80] 61 (02/06 0541) Resp:  [18] 18 (02/06 0541) BP: (125-143)/(56-80) 125/76 (02/06 0541) SpO2:  [94 %-100 %] 98 % (02/06 0819) Last BM Date: 07/24/18 Tylenol x 3 yesterday, oxycodone 10 x 2 yesterday,No ibuprofen 599 IV recorded Urine 1000 Afebrile, VSS WBC still up some ,but stable Intake/Output from previous day: 02/05 0701 - 02/06 0700 In: 599.8 [I.V.:470.2; IV Piggyback:129.6] Out: 1000 [Urine:1000] Intake/Output this shift: No intake/output data recorded.  General appearance: alert, cooperative and no distress Resp: clear to auscultation bilaterally GI: Large abdomen some soreness but not much.  Tolerating p.o.'s well drains out sites all look good.  Lab Results:  Recent Labs    07/24/18 0627 07/25/18 0612  WBC 12.8* 13.4*  HGB 8.1* 8.0*  HCT 27.2* 26.7*  PLT 417* 428*    BMET Recent Labs    07/24/18 0627  NA 137  K 3.9  CL 104  CO2 26  GLUCOSE 122*  BUN 7*  CREATININE 0.93  CALCIUM 8.5*   PT/INR No results for input(s): LABPROT, INR in the last 72 hours.  Recent Labs  Lab 07/20/18 0535 07/21/18 0539 07/22/18 0547 07/23/18 0620 07/24/18 0627  AST 84* 49* 37 28 31  ALT 82* 61* 44 33 29  ALKPHOS 153* 142* 136* 126 138*  BILITOT 2.3* 1.9* 1.2 1.1 0.9  PROT 5.0* 5.5* 5.3* 5.5* 5.5*  ALBUMIN 2.2* 2.3* 2.1* 2.1* 2.1*     Lipase     Component Value Date/Time   LIPASE 29 07/22/2018 1458     Medications: . acetaminophen  1,000 mg Oral Q8H  . bisacodyl  10 mg Rectal Daily  . DULoxetine  20 mg Oral QHS  . insulin aspart  0-9 Units Subcutaneous TID WC  . ipratropium-albuterol  3 mL Nebulization  Q6H  . methylPREDNISolone (SOLU-MEDROL) injection  40 mg Intravenous Daily  . oxybutynin  5 mg Oral BID  . pantoprazole  40 mg Oral BID  . polyethylene glycol  17 g Oral BID    Assessment/Plan Hyperlipidemia + THC Elevated troponin 0.03 x 2, <0.03 x 1 Hyponatremia125>>121>>126>>134>>136 Prediabetes - Glucose monitoring Leukocytosis - uncertain etilology AKI -creatinine 1.06>>1.19>>1.69>>1.58 Alk phos 372>>308>>223>>136 AST 350>>140>>60>> 37 ALT 308>>203>>114>>44 Total bilirubin 8.1>>7.9>>4.5>> 1.2 Lipase144>>59>>32 Leukocytosis - WBC 18.1 Anemia - H/H 13.8/42.9 (1/31) >> 8.1/26.9>>8.0/26.6 >> 8.1/27.2.  Abdominal pain, nausea and vomiting Mild pancreatitis - resolving 144>>59>>32 Acute hemorrhage along the margins of the stomach/GE junction-Liver bleeding on exam in OR Hyperbilirubinemia 6.2>>1.3- uncertain etiology Laparoscopic cholecystectomy, Snow placed over bleeding area of the GB/Liver -07/19/2018, Dr. Cleaster Corin # 6  FEN: IV fluids/vegitarian diet ID: Zosyn 1/29 - 07/24/18 DVT: SCDs added, She can be on DVT prophylaxis Follow-up: [facility] 08/06/2018 at 11:30 AM.  Follow-up appointment and discharge instructions are in the AVS.  She can shower from our standpoint whenever.      LOS: 8 days    Chon Buhl 07/25/2018 [phone]

## 2018-07-25 NOTE — Progress Notes (Signed)
PROGRESS NOTE  Kristen HarpBarbara Martinez ZOX:[id number] DOB: [birth year] DOA: 07/17/2018 PCP: Patient, No Pcp Per   LOS: 8 days   Brief narrative: Patient is a[age] year old female with history of dementia, type 2 diabetes, obstructive sleep apnea on CPAP who recently moved to [place] from [place] to live near to her daughter presented to the ED on 07/17/2018 with 2 days history of abdominal pain, nausea and multiple episodes of vomiting.  In the ED,patientwas found with elevated liver enzymes including elevated bilirubin, gallbladder stone with pericholecystic fluid as well as abnormal GE junction.  Surgery and GI were consulted  MRCP 1/30 did not show any evidence of biliary dilation but showed findings suggestive of cholecystitis. Patient most likely had CBD stone which she already passed,as evidenced by decreasing bilirubin level.Patient underwent laparoscopic cholecystectomyon 07/19/2018.  Per surgery note, patient had a gangrenous gallbladder with a large gallbladder stone which caused the gallbladder to press over the common bile duct causing obstructive jaundice (Mirrizzi syndrome).  Subjective: States that from her abdominal standpoint she feels fine.  However reports wheezing more today.  Mostly dry cough.  Intermittent dyspnea.  No chest pain, fever or chills.  Brief smoking during her teenage years.  However exposed to passive smoking from family members during her younger age.  As per daughter, has used inhalers in the past.  Reported reaction to prednisone but unclear because she took this along with many other medications at that time.  Assessment/Plan:  Principal Problem:   Acute calculous cholecystitis s/p lap cholecystectomy 07/19/2018 Active Problems:   Liver disease   Hyponatremia   Prolonged QT interval   OSA on CPAP   Pericardial effusion   Dementia (HCC)   Ascites   Elevated LFTs   Obesity (BMI 30-39.9)  Acute cholecystitis/cholelithiasis- status post lap chole. Pain  controlled.  CCS follow-up appreciated and state that she does not appear to have a bile leak, drainage appears to be old bloody fluid and recommend stopping antibiotics.  Drain removed 2/5.  Completed IV Zosyn 1/29-2/5.  CCS signed off and have arranged follow-up appointment 08/06/2018.  Elevated liver enzymes- After cholecystectomy, liver enzymes have almost normalized.  Acute intra-abdominal hemorrhage-MRCP 07/18/2018 showed an acute hemorrhage along the stomach tracks partially adjacent to the spleen, down to the left adrenal gland and to the left perirenalspace. Unclear etiology.  No evidence of perforation. No evidence of splenic artery aneurysm. Hemoglobin has been stable in the low 8 g range for the last 3 days.  CCS follow-up appreciated: Jamelle HaringSnow placed over bleeding area of GB/liver 07/19/2018.  Anemia: Stable.  No overt bleeding.  Hypokalemia: Replaced.  Magnesium normal.  Low-grade temperature: Blood cultures x2: Negative.  Chest x-ray 2/3 showed left basilar airspace disease, suspect atelectasis rather than pneumonia given her symptomatology.  Leukocytosis is improved to 12.8.  Except low-grade temperature spike of 100.6 on 2/3, has essentially been afebrile.  Discontinued Zosyn and monitor. Ongoing mild leukocytosis/13.4, no significant fevers.  Her respiratory symptoms not suggestive clinically of pneumonia.  Repeated chest x-ray 2/6 and personally reviewed: Bibasilar atelectasis and?  Pulmonary congestion.  Acute bronchitis With clinical bronchospasm.?  Underlying history of reactive airway disease or COPD-needs to be followed up with pulmonology.  Although prednisone allergy listed, unclear if true allergy or not.  Willing to try a dose of IV Solu-Medrol after explaining risks and benefits to patient and daughter at bedside.  Continue bronchodilator nebs.  Aggressive incentive spirometry.  Chest x-ray as above.  Also give a dose of Lasix 20 mg  p.o. x1.  Hyponatremia:Sodium  level was low at 121 on admission.Suspect secondary to dehydration. Resolved.  Acute kidney injury:Creatinine peaked at 1.69.  Resolved.  Obstructive sleep apnea:On CPAP at night. Refuses to use it in the hospital.  Depression-continue Cymbalta  Type II DM: Continue SSI.  Monitor CBGs.  A1c 6.3 on 1/30.   DVT prophylaxis:SCDs Code Status:Full Family Communication:Discussed in detail with patient's daughter at bedside, updated care and answered questions. Disposition Plan:DC home pending clinical improvement, hopefully in the next day or 2.  Consultants:  GI  Surgery  Procedures:  Lap cholecystectomy on 07/19/2018  Antimicrobials:  Anti-infectives (From admission, onward)   Start     Dose/Rate Route Frequency Ordered Stop   07/18/18 0600  piperacillin-tazobactam (ZOSYN) IVPB 3.375 g  Status:  Discontinued     3.375 g 12.5 mL/hr over 240 Minutes Intravenous Every 8 hours 07/17/18 2008 07/24/18 1658   07/17/18 2030  piperacillin-tazobactam (ZOSYN) IVPB 3.375 g     3.375 g 100 mL/hr over 30 Minutes Intravenous  Once 07/17/18 2006 07/17/18 2242   07/17/18 1830  ciprofloxacin (CIPRO) IVPB 400 mg     400 mg 200 mL/hr over 60 Minutes Intravenous  Once 07/17/18 1822 07/17/18 2154      Infusions:    Scheduled Meds: . acetaminophen  1,000 mg Oral Q8H  . bisacodyl  10 mg Rectal Daily  . DULoxetine  20 mg Oral QHS  . insulin aspart  0-9 Units Subcutaneous TID WC  . ipratropium-albuterol  3 mL Nebulization Q6H  . methylPREDNISolone (SOLU-MEDROL) injection  40 mg Intravenous Daily  . oxybutynin  5 mg Oral BID  . pantoprazole  40 mg Oral BID  . polyethylene glycol  17 g Oral BID    PRN meds: bisacodyl, HYDROmorphone, ibuprofen, ipratropium-albuterol, ondansetron (ZOFRAN) IV, oxyCODONE, phenol, traMADol   Objective: Vitals:   07/25/18 1302 07/25/18 1452  BP: (!) 144/72   Pulse: 90   Resp:    Temp:    SpO2: 97% 98%   Physical Exam: GENERAL:  Pleasant elderly female, moderately built and obese sitting up comfortably in bed this morning. CHEST: Harsh breath sounds bilaterally with scattered few expiratory rhonchi mostly posteriorly.  Occasional basal crackles.  No increased work of breathing. CVS: S1 and S2 heard, RRR.  No JVD, murmurs or pedal edema. ABDOMEN: Soft, obese but not distended.  Right JP drain has been removed and the site is without acute findings.  Nontender.  Normal bowel sounds heard. EXTREMITIES: No edema.  Symmetric 5 x 5 power. CNS: Alert, awake, oriented x3.  No focal neurological deficits. SKIN: warm and dry without rashes.  Data Review: I have personally reviewed the laboratory data and studies available.  Recent Labs  Lab 07/20/18 0535 07/21/18 0539 07/22/18 0547 07/23/18 0620 07/24/18 0627 07/25/18 0612  WBC 16.0* 16.1* 18.4* 17.1* 12.8* 13.4*  NEUTROABS 13.6* 11.1* 11.6*  --  9.9* 10.5*  HGB 8.7* 8.5* 8.1* 8.0* 8.1* 8.0*  HCT 27.7* 27.2* 26.9* 26.6* 27.2* 26.7*  MCV 87.9 88.6 90.6 91.7 91.0 91.1  PLT 324 315 309 338 417* 428*   Recent Labs  Lab 07/19/18 0608 07/20/18 0535 07/21/18 0539 07/22/18 0547 07/24/18 0627  NA 134* 136 136 136 137  K 3.4* 3.5 3.1* 3.4* 3.9  CL 103 107 106 107 104  CO2 20* 21* 23 22 26   GLUCOSE 119* 140* 117* 122* 122*  BUN 22 17 13 8  7*  CREATININE 1.58* 1.18* 0.97 0.86 0.93  CALCIUM 8.1* 8.1* 8.4*  8.3* 8.5*  MG  --   --   --   --  1.8    Recent Labs  Lab 07/20/18 0535 07/21/18 0539 07/22/18 0547 07/23/18 0620 07/24/18 0627  AST 84* 49* 37 28 31  ALT 82* 61* 44 33 29  ALKPHOS 153* 142* 136* 126 138*  BILITOT 2.3* 1.9* 1.2 1.1 0.9  PROT 5.0* 5.5* 5.3* 5.5* 5.5*  ALBUMIN 2.2* 2.3* 2.1* 2.1* 2.1*    Marcellus Scott, MD, [place], [facility]. [facility]  To contact the attending provider between 7A-7P or the covering provider during after hours 7P-7A, please log into the web site [email or web address] and access using universal [place] password for that web  site. If you do not have the password, please call the hospital operator.

## 2018-07-26 ENCOUNTER — Inpatient Hospital Stay (HOSPITAL_COMMUNITY): Payer: Medicare Other

## 2018-07-26 ENCOUNTER — Encounter (HOSPITAL_COMMUNITY): Payer: Self-pay | Admitting: Radiology

## 2018-07-26 LAB — CBC WITH DIFFERENTIAL/PLATELET
Abs Immature Granulocytes: 0.29 10*3/uL — ABNORMAL HIGH (ref 0.00–0.07)
Basophils Absolute: 0 10*3/uL (ref 0.0–0.1)
Basophils Relative: 0 %
Eosinophils Absolute: 0 10*3/uL (ref 0.0–0.5)
Eosinophils Relative: 0 %
HCT: 27.8 % — ABNORMAL LOW (ref 36.0–46.0)
Hemoglobin: 8.4 g/dL — ABNORMAL LOW (ref 12.0–15.0)
Immature Granulocytes: 2 %
Lymphocytes Relative: 12 %
Lymphs Abs: 1.7 10*3/uL (ref 0.7–4.0)
MCH: 27.9 pg (ref 26.0–34.0)
MCHC: 30.2 g/dL (ref 30.0–36.0)
MCV: 92.4 fL (ref 80.0–100.0)
Monocytes Absolute: 0.7 10*3/uL (ref 0.1–1.0)
Monocytes Relative: 5 %
Neutro Abs: 11.8 10*3/uL — ABNORMAL HIGH (ref 1.7–7.7)
Neutrophils Relative %: 81 %
Platelets: 468 10*3/uL — ABNORMAL HIGH (ref 150–400)
RBC: 3.01 MIL/uL — ABNORMAL LOW (ref 3.87–5.11)
RDW: 16.3 % — ABNORMAL HIGH (ref 11.5–15.5)
WBC: 14.5 10*3/uL — ABNORMAL HIGH (ref 4.0–10.5)
nRBC: 0.1 % (ref 0.0–0.2)

## 2018-07-26 LAB — BASIC METABOLIC PANEL
Anion gap: 6 (ref 5–15)
BUN: 9 mg/dL (ref 8–23)
CO2: 28 mmol/L (ref 22–32)
Calcium: 8.6 mg/dL — ABNORMAL LOW (ref 8.9–10.3)
Chloride: 105 mmol/L (ref 98–111)
Creatinine, Ser: 0.88 mg/dL (ref 0.44–1.00)
GFR calc Af Amer: >60 mL/min (ref >60)
GFR calc non Af Amer: >60 mL/min (ref >60)
Glucose, Bld: 120 mg/dL — ABNORMAL HIGH (ref 70–99)
Potassium: 3.8 mmol/L (ref 3.5–5.1)
Sodium: 139 mmol/L (ref 135–145)

## 2018-07-26 LAB — GLUCOSE, CAPILLARY
Glucose-Capillary: 95 mg/dL (ref 70–99)
Glucose-Capillary: 139 mg/dL — ABNORMAL HIGH (ref 70–99)
Glucose-Capillary: 149 mg/dL — ABNORMAL HIGH (ref 70–99)
Glucose-Capillary: 147 mg/dL — ABNORMAL HIGH (ref 70–99)

## 2018-07-26 MED ORDER — METHYLPREDNISOLONE 4 MG PO TBPK
8.0000 mg | ORAL_TABLET | Freq: Every morning | ORAL | Status: DC
  Filled 2018-07-26: qty 21

## 2018-07-26 MED ORDER — METHYLPREDNISOLONE 4 MG PO TBPK
8.0000 mg | ORAL_TABLET | Freq: Every evening | ORAL | Status: AC
  Administered 2018-07-27: 8 mg via ORAL

## 2018-07-26 MED ORDER — IOHEXOL 300 MG/ML  SOLN: 30.0000 mL | Freq: Once | INTRAMUSCULAR | Status: DC | PRN

## 2018-07-26 MED ORDER — IOHEXOL 300 MG/ML  SOLN
100.0000 mL | Freq: Once | INTRAMUSCULAR | Status: AC | PRN
  Administered 2018-07-26: 100 mL via INTRAVENOUS

## 2018-07-26 MED ORDER — METHYLPREDNISOLONE 4 MG PO TBPK
4.0000 mg | ORAL_TABLET | ORAL | Status: AC
  Administered 2018-07-27: 4 mg via ORAL

## 2018-07-26 MED ORDER — METHYLPREDNISOLONE 4 MG PO TBPK: 8.0000 mg | ORAL_TABLET | Freq: Every evening | ORAL | Status: DC

## 2018-07-26 MED ORDER — METHYLPREDNISOLONE 4 MG PO TBPK: 4.0000 mg | ORAL_TABLET | ORAL | Status: DC

## 2018-07-26 MED ORDER — METHYLPREDNISOLONE 4 MG PO TBPK
8.0000 mg | ORAL_TABLET | Freq: Every morning | ORAL | Status: AC
  Administered 2018-07-27: 8 mg via ORAL
  Filled 2018-07-26: qty 21

## 2018-07-26 MED ORDER — METHYLPREDNISOLONE 4 MG PO TBPK
4.0000 mg | ORAL_TABLET | Freq: Three times a day (TID) | ORAL | Status: AC
  Administered 2018-07-28 (×3): 4 mg via ORAL

## 2018-07-26 MED ORDER — METHYLPREDNISOLONE 4 MG PO TBPK
8.0000 mg | ORAL_TABLET | Freq: Every evening | ORAL | Status: AC
  Administered 2018-07-28: 8 mg via ORAL

## 2018-07-26 MED ORDER — IPRATROPIUM-ALBUTEROL 0.5-2.5 (3) MG/3ML IN SOLN
3.0000 mL | Freq: Three times a day (TID) | RESPIRATORY_TRACT | Status: DC
  Administered 2018-07-27 – 2018-07-28 (×6): 3 mL via RESPIRATORY_TRACT
  Filled 2018-07-26 (×5): qty 3

## 2018-07-26 MED ORDER — METHYLPREDNISOLONE 4 MG PO TBPK: 4.0000 mg | ORAL_TABLET | Freq: Four times a day (QID) | ORAL | Status: DC

## 2018-07-26 MED ORDER — SODIUM CHLORIDE (PF) 0.9 % IJ SOLN
INTRAMUSCULAR | Status: AC
  Filled 2018-07-26: qty 50

## 2018-07-26 MED ORDER — METHYLPREDNISOLONE 4 MG PO TBPK
4.0000 mg | ORAL_TABLET | Freq: Four times a day (QID) | ORAL | Status: DC
  Administered 2018-07-29 (×2): 4 mg via ORAL

## 2018-07-26 MED ORDER — DONEPEZIL HCL 10 MG PO TABS
10.0000 mg | ORAL_TABLET | Freq: Every day | ORAL | Status: DC
  Administered 2018-07-26 – 2018-07-28 (×3): 10 mg via ORAL
  Filled 2018-07-26 (×3): qty 1

## 2018-07-26 MED ORDER — SENNA 8.6 MG PO TABS
2.0000 | ORAL_TABLET | Freq: Every day | ORAL | Status: DC
  Administered 2018-07-26 – 2018-07-29 (×3): 17.2 mg via ORAL
  Filled 2018-07-26 (×4): qty 2

## 2018-07-26 MED ORDER — METHYLPREDNISOLONE 4 MG PO TBPK: 4.0000 mg | ORAL_TABLET | Freq: Three times a day (TID) | ORAL | Status: DC

## 2018-07-26 NOTE — Progress Notes (Signed)
Occupational Therapy Treatment Patient Details Name: Kristen HarpBarbara Martinez MRN: [id number] DOB: 03-31-1941 Today's Date: 07/26/2018    History of present illness 78 year old female with history of dementia, type 2 diabetes, obstructive sleep apnea on CPAP who recently moved to [place] from [place] to live near to her daughter and admitted for Acute cholecystitis/cholelithiasis and s/p lap chole on 07/19/18   OT comments  Pt agreeable to OT, but tx limited.  Pt is now able to don pants without AE. She did not want to walk to bathroom as she was recently there.  Follow Up Recommendations  Home health OT;Supervision/Assistance - 24 hour    Equipment Recommendations  3 in 1 bedside commode    Recommendations for Other Services      Precautions / Restrictions Precautions Precautions: Fall Precaution Comments: monitor O2  Restrictions Weight Bearing Restrictions: No       Mobility Bed Mobility         Supine to sit: Supervision Sit to supine: Supervision      Transfers   Equipment used: (bedrail)   Sit to Stand: Min guard         General transfer comment: for safety; to sidestep up [facility]    Balance                                           ADL either performed or assessed with clinical judgement   ADL                       Lower Body Dressing: Minimal assistance Lower Body Dressing Details (indicate cue type and reason): pants without AD; sock aide for socks               General ADL Comments: pt's drain is out. She is now able to reach and don pants comfortably without AD. Pt just got up to bathroom and did not want to walk again     Vision       Perception     Praxis      Cognition Arousal/Alertness: Awake/alert Behavior During Therapy: WFL for tasks assessed/performed Overall Cognitive Status: Within Functional Limits for tasks assessed                                 General Comments: h/o decreased memory  at baseline        Exercises     Shoulder Instructions       General Comments      Pertinent Vitals/ Pain       Pain Assessment: No/denies pain  Home Living                                          Prior Functioning/Environment              Frequency  Min 2X/week        Progress Toward Goals  OT Goals(current goals can now be found in the care plan section)  Progress towards OT goals: Progressing toward goals     Plan      Co-evaluation                 AM-PAC OT "6 Clicks" Daily Activity  Outcome Measure   Help from another person eating meals?: None Help from another person taking care of personal grooming?: A Little Help from another person toileting, which includes using toliet, bedpan, or urinal?: A Lot Help from another person bathing (including washing, rinsing, drying)?: A Little Help from another person to put on and taking off regular upper body clothing?: A Little Help from another person to put on and taking off regular lower body clothing?: A Little 6 Click Score: 18    End of Session    OT Visit Diagnosis: Muscle weakness (generalized) (M62.81);Unsteadiness on feet (R26.81)   Activity Tolerance Patient tolerated treatment well;Patient limited by pain   Patient Left in bed;with call bell/phone within reach;with bed alarm set   Nurse Communication          Time: 1337-1350 OT Time Calculation (min): 13 min  Charges: OT General Charges $OT Visit: 1 Visit OT Treatments $Self Care/Home Management : 8-22 mins  Marica Otter, OTR/L Acute Rehabilitation Services [phone] [facility] pager [phone] office 07/26/2018   Kristen Martinez 07/26/2018, 2:56 PM

## 2018-07-26 NOTE — Progress Notes (Signed)
PROGRESS NOTE  Kristen Martinez JDY:[id number] DOB: 21-Jun-1940 DOA: 07/17/2018 PCP: Patient, No Pcp Per   LOS: 9 days   Brief narrative: Patient is a69 year old female with history of dementia, type 2 diabetes, obstructive sleep apnea on CPAP who recently moved to [place] from [place] to live near to her daughter presented to the ED on 07/17/2018 with 2 days history of abdominal pain, nausea and multiple episodes of vomiting.  In the ED,patientwas found with elevated liver enzymes including elevated bilirubin, gallbladder stone with pericholecystic fluid as well as abnormal GE junction.  Surgery and GI were consulted  MRCP 1/30 did not show any evidence of biliary dilation but showed findings suggestive of cholecystitis. Patient most likely had CBD stone which she already passed,as evidenced by decreasing bilirubin level.Patient underwent laparoscopic cholecystectomyon 07/19/2018.  Per surgery note, patient had a gangrenous gallbladder with a large gallbladder stone which caused the gallbladder to press over the common bile duct causing obstructive jaundice (Mirrizzi syndrome).  On 2/7, CT abdomen and pelvis showed large complex fluid collection in upper abdomen- hemorrhage versus bilioma and CCS requesting drain placement by IR.  Subjective: Denies abdominal symptoms.  States that her breathing and wheezing have significantly improved.  No BM for last couple days, states that she uses senna possibly at home.  Assessment/Plan:  Principal Problem:   Acute calculous cholecystitis s/p lap cholecystectomy 07/19/2018 Active Problems:   Liver disease   Hyponatremia   Prolonged QT interval   OSA on CPAP   Pericardial effusion   Dementia (HCC)   Ascites   Elevated LFTs   Obesity (BMI 30-39.9)  Acute cholecystitis/cholelithiasis- status post lap chole. Pain controlled.  CCS follow-up appreciated and state that she does not appear to have a bile leak, drainage appears to be old bloody  fluid and recommend stopping antibiotics.  Drain removed 2/5.  Completed IV Zosyn 1/29-2/5.  I discussed with Dr. Michaell Cowing this morning who was concerned about her increasing leukocytosis and requested CT abdomen-see below.  On 2/7, CT abdomen and pelvis showed large complex fluid collection in upper abdomen- hemorrhage versus bilioma and CCS requesting drain placement by IR.  I discussed with Dr. Bradly Chris, Radiology and Dr. Michaell Cowing, CCS.  Elevated liver enzymes- After cholecystectomy, liver enzymes have almost normalized.  Acute intra-abdominal hemorrhage-MRCP 07/18/2018 showed an acute hemorrhage along the stomach tracks partially adjacent to the spleen, down to the left adrenal gland and to the left perirenalspace. Unclear etiology.  No evidence of perforation. No evidence of splenic artery aneurysm. Hemoglobin has been stable in the low 8 g range for the last 3 days.  CCS follow-up appreciated: Jamelle Haring placed over bleeding area of GB/liver 07/19/2018.  Anemia: Stable.  No overt bleeding.  Hypokalemia: Replaced.  Magnesium normal.  Low-grade temperature: Blood cultures x2: Negative.  Chest x-ray 2/3 showed left basilar airspace disease, suspect atelectasis rather than pneumonia given her symptomatology.  Leukocytosis is improved to 12.8.  Except low-grade temperature spike of 100.6 on 2/3, has essentially been afebrile.  Discontinued Zosyn and monitor. Ongoing mild leukocytosis/13.4, no significant fevers.  Her respiratory symptoms not suggestive clinically of pneumonia.  Repeated chest x-ray 2/6 and personally reviewed: Bibasilar atelectasis and?  Pulmonary congestion.  Acute bronchitis With clinical bronchospasm.?  Underlying history of reactive airway disease or COPD-needs to be followed up with pulmonology.  Although prednisone allergy listed, unclear if true allergy or not.  Willing to try a dose of IV Solu-Medrol after explaining risks and benefits to patient and daughter at bedside.  Continue  bronchodilator nebs.  Aggressive incentive spirometry.  Chest x-ray as above.  Also give a dose of Lasix 20 mg p.o. x1.  Tolerated Solu-Medrol.  Clinically improved.  Transition to Medrol dose taper beginning 2/8.  Hyponatremia:Sodium level was low at 121 on admission.Suspect secondary to dehydration. Resolved.  Acute kidney injury:Creatinine peaked at 1.69.  Resolved.  Obstructive sleep apnea:On CPAP at night. Refuses to use it in the hospital.  Depression-continue Cymbalta  Type II DM: Continue SSI.  Monitor CBGs.  A1c 6.3 on 1/30.   DVT prophylaxis:SCDs Code Status:Full Family Communication:None at bedside today. Disposition Plan:DC home pending clinical improvement, hopefully in the next day or 2.  Consultants:  GI  Surgery  Procedures:  Lap cholecystectomy on 07/19/2018  Antimicrobials:  Anti-infectives (From admission, onward)   Start     Dose/Rate Route Frequency Ordered Stop   07/18/18 0600  piperacillin-tazobactam (ZOSYN) IVPB 3.375 g  Status:  Discontinued     3.375 g 12.5 mL/hr over 240 Minutes Intravenous Every 8 hours 07/17/18 2008 07/24/18 1658   07/17/18 2030  piperacillin-tazobactam (ZOSYN) IVPB 3.375 g     3.375 g 100 mL/hr over 30 Minutes Intravenous  Once 07/17/18 2006 07/17/18 2242   07/17/18 1830  ciprofloxacin (CIPRO) IVPB 400 mg     400 mg 200 mL/hr over 60 Minutes Intravenous  Once 07/17/18 1822 07/17/18 2154      Infusions:    Scheduled Meds: . acetaminophen  1,000 mg Oral Q8H  . bisacodyl  10 mg Rectal Daily  . donepezil  10 mg Oral QHS  . DULoxetine  20 mg Oral QHS  . insulin aspart  0-9 Units Subcutaneous TID WC  . ipratropium-albuterol  3 mL Nebulization Q6H  . [START ON 07/27/2018] methylPREDNISolone  4 mg Oral PC lunch  . [START ON 07/27/2018] methylPREDNISolone  4 mg Oral PC supper  . [START ON 07/28/2018] methylPREDNISolone  4 mg Oral 3 x daily with food  . [START ON 07/29/2018] methylPREDNISolone  4 mg Oral 4X  daily taper  . [START ON 07/27/2018] methylPREDNISolone  8 mg Oral AC breakfast  . [START ON 07/27/2018] methylPREDNISolone  8 mg Oral Nightly  . [START ON 07/28/2018] methylPREDNISolone  8 mg Oral Nightly  . oxybutynin  5 mg Oral BID  . pantoprazole  40 mg Oral BID  . polyethylene glycol  17 g Oral BID  . senna  2 tablet Oral Daily  . sodium chloride (PF)        PRN meds: bisacodyl, HYDROmorphone, ibuprofen, iohexol, ipratropium-albuterol, ondansetron (ZOFRAN) IV, oxyCODONE, phenol, traMADol   Objective: Vitals:   07/26/18 0603 07/26/18 0729  BP: (!) 143/86   Pulse: 94   Resp: 18   Temp: 97.9 F (36.6 C)   SpO2: 100% 92%   Physical Exam: GENERAL: Pleasant elderly female, moderately built and obese sitting up comfortably in bed this morning.  Does not appear in any distress. CHEST: Much improved.  Good air entry.  Occasional rhonchi posteriorly but otherwise clear to auscultation.  No increased work of breathing. CVS: S1 and S2 heard, RRR.  No JVD, murmurs or pedal edema. ABDOMEN: Abdomen appears distended but patient states that this is her usual size, soft and nontender.  No organomegaly or masses appreciated.  Normal bowel sounds heard. EXTREMITIES: No edema.  Symmetric 5 x 5 power. CNS: Alert, awake, oriented x3.  No focal neurological deficits. SKIN: warm and dry without rashes.  Data Review: I have personally reviewed the laboratory data and studies  available.  Recent Labs  Lab 07/21/18 0539 07/22/18 0547 07/23/18 0620 07/24/18 0627 07/25/18 0612 07/26/18 0620  WBC 16.1* 18.4* 17.1* 12.8* 13.4* 14.5*  NEUTROABS 11.1* 11.6*  --  9.9* 10.5* 11.8*  HGB 8.5* 8.1* 8.0* 8.1* 8.0* 8.4*  HCT 27.2* 26.9* 26.6* 27.2* 26.7* 27.8*  MCV 88.6 90.6 91.7 91.0 91.1 92.4  PLT 315 309 338 417* 428* 468*   Recent Labs  Lab 07/20/18 0535 07/21/18 0539 07/22/18 0547 07/24/18 0627 07/26/18 0620  NA 136 136 136 137 139  K 3.5 3.1* 3.4* 3.9 3.8  CL 107 106 107 104 105  CO2 21* 23  22 26 28   GLUCOSE 140* 117* 122* 122* 120*  BUN 17 13 8  7* 9  CREATININE 1.18* 0.97 0.86 0.93 0.88  CALCIUM 8.1* 8.4* 8.3* 8.5* 8.6*  MG  --   --   --  1.8  --     Recent Labs  Lab 07/20/18 0535 07/21/18 0539 07/22/18 0547 07/23/18 0620 07/24/18 0627  AST 84* 49* 37 28 31  ALT 82* 61* 44 33 29  ALKPHOS 153* 142* 136* 126 138*  BILITOT 2.3* 1.9* 1.2 1.1 0.9  PROT 5.0* 5.5* 5.3* 5.5* 5.5*  ALBUMIN 2.2* 2.3* 2.1* 2.1* 2.1*    Marcellus Scott, MD, [place], [facility]. [facility]  To contact the attending provider between 7A-7P or the covering provider during after hours 7P-7A, please log into the web site [email or web address] and access using universal [place] password for that web site. If you do not have the password, please call the hospital operator.

## 2018-07-26 NOTE — Progress Notes (Signed)
7 Days Post-Op    CC:  Abdominal pain  Subjective: Pt is alert and worried she is not on her Donepezil for her Alzheimer's.  Denies any abdominal pain.  Sites are fine drain is out.  No BM yesterday, tolerating diet well.  Objective: Vital signs in last 24 hours: Temp:  [97.9 F (36.6 C)-99 F (37.2 C)] 97.9 F (36.6 C) (02/07 0603) Pulse Rate:  [90-96] 94 (02/07 0603) Resp:  [18] 18 (02/07 0603) BP: (143-155)/(72-86) 143/86 (02/07 0603) SpO2:  [92 %-100 %] 92 % (02/07 0729) Last BM Date: 07/25/18 360 PO 2200 urine No BM recorded Afebrle, VSS WBC is 14.5 This AM CXR yesterday:  Enlargement of cardiac silhouette with RIGHT basilar atelectasis.  LEFT lower lobe atelectasis versus consolidation with associated small LEFT pleural effusion. Intake/Output from previous day: 02/06 0701 - 02/07 0700 In: 360 [P.O.:360] Out: 2200 [Urine:2200] Intake/Output this shift: No intake/output data recorded.  General appearance: alert, cooperative and no distress Resp: Not wheezing this AM, but on O2. GI: soft, normal post op soreness.  Sites OK  Lab Results:  Recent Labs    07/25/18 0612 07/26/18 0620  WBC 13.4* 14.5*  HGB 8.0* 8.4*  HCT 26.7* 27.8*  PLT 428* 468*    BMET Recent Labs    07/24/18 0627 07/26/18 0620  NA 137 139  K 3.9 3.8  CL 104 105  CO2 26 28  GLUCOSE 122* 120*  BUN 7* 9  CREATININE 0.93 0.88  CALCIUM 8.5* 8.6*   PT/INR No results for input(s): LABPROT, INR in the last 72 hours.  Recent Labs  Lab 07/20/18 0535 07/21/18 0539 07/22/18 0547 07/23/18 0620 07/24/18 0627  AST 84* 49* 37 28 31  ALT 82* 61* 44 33 29  ALKPHOS 153* 142* 136* 126 138*  BILITOT 2.3* 1.9* 1.2 1.1 0.9  PROT 5.0* 5.5* 5.3* 5.5* 5.5*  ALBUMIN 2.2* 2.3* 2.1* 2.1* 2.1*     Lipase     Component Value Date/Time   LIPASE 29 07/22/2018 1458     Medications: . acetaminophen  1,000 mg Oral Q8H  . bisacodyl  10 mg Rectal Daily  . DULoxetine  20 mg Oral QHS  . insulin  aspart  0-9 Units Subcutaneous TID WC  . ipratropium-albuterol  3 mL Nebulization Q6H  . methylPREDNISolone (SOLU-MEDROL) injection  40 mg Intravenous Daily  . oxybutynin  5 mg Oral BID  . pantoprazole  40 mg Oral BID  . polyethylene glycol  17 g Oral BID    Assessment/Plan Hyperlipidemia + THC Elevated troponin 0.03 x 2, <0.03 x 1 Hyponatremia125>>121>>126>>134>>136 Prediabetes - Glucose monitoring Leukocytosis -  21.7(1/29) >> 17.1 (2/4) >>12.8 >>13.4 >>14.5 today AKI -creatinine 1.06>>1.19>>1.69>>1.58 Alk phos 372>>308>>223>>136 AST 350>>140>>60>> 37 ALT 308>>203>>114>>44 Total bilirubin 8.1>>7.9>>4.5>> 1.2 Lipase144>>59>>32 Leukocytosis - WBC 18.1 Anemia - H/H 13.8/42.9 (1/31) >> 8.1/26.9>>8.0/26.6>>8.1/27.2.  Abdominal pain, nausea and vomiting Mild pancreatitis - resolving 144>>59>>32 Acute hemorrhage along the margins of the stomach/GE junction-Liver bleeding on exam in OR Hyperbilirubinemia 6.4>>4.0- uncertain etiology Laparoscopic cholecystectomy, Snow placed over bleeding area of the GB/Liver -07/19/2018, Dr. Cleaster Corin #6  FEN: IV fluids/vegitarian diet ID: Zosyn 1/29 - 07/24/18 DVT: SCDs, She can be on DVT prophylaxis from our standpoint   Plan:  Reviewed with Dr. Johney Maine.  She had a very difficult gallbladder so we are repeating the CT scan today just to be sure there is not an intraabdominal issue. I talked with Sanela Evola her daughter [phone], about the plans to check the CT  just to be sure there is nothing occurring that would cause her slow WBC elevation.  We will need to talk with her after the study is completed.   LOS: 9 days    , 07/26/2018 [phone]

## 2018-07-27 LAB — CBC
HCT: 26 % — ABNORMAL LOW (ref 36.0–46.0)
Hemoglobin: 8 g/dL — ABNORMAL LOW (ref 12.0–15.0)
MCH: 28.2 pg (ref 26.0–34.0)
MCHC: 30.8 g/dL (ref 30.0–36.0)
MCV: 91.5 fL (ref 80.0–100.0)
Platelets: 491 10*3/uL — ABNORMAL HIGH (ref 150–400)
RBC: 2.84 MIL/uL — ABNORMAL LOW (ref 3.87–5.11)
RDW: 16.2 % — ABNORMAL HIGH (ref 11.5–15.5)
WBC: 14.4 10*3/uL — ABNORMAL HIGH (ref 4.0–10.5)
nRBC: 0.1 % (ref 0.0–0.2)

## 2018-07-27 LAB — GLUCOSE, CAPILLARY
Glucose-Capillary: 85 mg/dL (ref 70–99)
Glucose-Capillary: 91 mg/dL (ref 70–99)
Glucose-Capillary: 118 mg/dL — ABNORMAL HIGH (ref 70–99)
Glucose-Capillary: 198 mg/dL — ABNORMAL HIGH (ref 70–99)

## 2018-07-27 LAB — COMPREHENSIVE METABOLIC PANEL
ALT: 28 U/L (ref 0–44)
AST: 49 U/L — ABNORMAL HIGH (ref 15–41)
Albumin: 2.2 g/dL — ABNORMAL LOW (ref 3.5–5.0)
Alkaline Phosphatase: 152 U/L — ABNORMAL HIGH (ref 38–126)
Anion gap: 8 (ref 5–15)
BUN: 11 mg/dL (ref 8–23)
CO2: 26 mmol/L (ref 22–32)
Calcium: 8.7 mg/dL — ABNORMAL LOW (ref 8.9–10.3)
Chloride: 105 mmol/L (ref 98–111)
Creatinine, Ser: 0.83 mg/dL (ref 0.44–1.00)
GFR calc Af Amer: >60 mL/min (ref >60)
GFR calc non Af Amer: >60 mL/min (ref >60)
Glucose, Bld: 103 mg/dL — ABNORMAL HIGH (ref 70–99)
Potassium: 3.7 mmol/L (ref 3.5–5.1)
Sodium: 139 mmol/L (ref 135–145)
Total Bilirubin: 1.2 mg/dL (ref 0.3–1.2)
Total Protein: 5.7 g/dL — ABNORMAL LOW (ref 6.5–8.1)

## 2018-07-27 LAB — TOTAL BILIRUBIN, BODY FLUID: Total bilirubin, fluid: 1.2 mg/dL

## 2018-07-27 LAB — PROTIME-INR
INR: 1.01
Prothrombin Time: 13.2 seconds (ref 11.4–15.2)

## 2018-07-27 LAB — LIPASE, FLUID: Lipase-Fluid: 25 U/L

## 2018-07-27 MED ORDER — MENTHOL 3 MG MT LOZG
1.0000 | LOZENGE | OROMUCOSAL | Status: DC | PRN
  Filled 2018-07-27: qty 9

## 2018-07-27 NOTE — Progress Notes (Signed)
Assessment & Plan: POD#8 Laparoscopic cholecystectomy, 07/19/2018, Dr. Almond Lint  Post op bleeding on MRI  CT scan yesterday reviewed by IR - do not want to place perc drain at this time  No evidence of ongoing bile leak at present  Will allow regular diet today and monitor.  Repeat labs in AM 2/9.  Suspect elevated WBC due to IV Solumedrol and not infection.  Repeat CBC in AM 2/9.  Discussed with patient's daughter by telephone who agrees with this plan.         Darnell Level, MD       [facility]       Office: [phone]   Chief Complaint: Acute calculous cholecystitis  Subjective: Patient in bed, denies pain.  Wants to eat.  Has ambulated today.  Objective: Vital signs in last 24 hours: Temp:  [98.3 F (36.8 C)-99.3 F (37.4 C)] 99.1 F (37.3 C) (02/08 0926) Pulse Rate:  [72-91] 78 (02/08 1224) Resp:  [17-20] 20 (02/08 1224) BP: (120-147)/(51-78) 120/51 (02/08 1224) SpO2:  [91 %-100 %] 99 % (02/08 1224) Last BM Date: 07/27/18  Intake/Output from previous day: 02/07 0701 - 02/08 0700 In: 480 [P.O.:480] Out: 2850 [Urine:2850] Intake/Output this shift: No intake/output data recorded.  Physical Exam: HEENT - sclerae clear, mucous membranes moist Neck - soft Abdomen - soft, obese; non-tender; wounds dry and intact Neuro - alert & oriented, no focal deficits  Lab Results:  Recent Labs    07/26/18 0620 07/27/18 0604  WBC 14.5* 14.4*  HGB 8.4* 8.0*  HCT 27.8* 26.0*  PLT 468* 491*   BMET Recent Labs    07/26/18 0620 07/27/18 0604  NA 139 139  K 3.8 3.7  CL 105 105  CO2 28 26  GLUCOSE 120* 103*  BUN 9 11  CREATININE 0.88 0.83  CALCIUM 8.6* 8.7*   PT/INR Recent Labs    07/27/18 0604  LABPROT 13.2  INR 1.01   Comprehensive Metabolic Panel:    Component Value Date/Time   NA 139 07/27/2018 0604   NA 139 07/26/2018 0620   K 3.7 07/27/2018 0604   K 3.8 07/26/2018 0620   CL 105 07/27/2018 0604   CL 105 07/26/2018  0620   CO2 26 07/27/2018 0604   CO2 28 07/26/2018 0620   BUN 11 07/27/2018 0604   BUN 9 07/26/2018 0620   CREATININE 0.83 07/27/2018 0604   CREATININE 0.88 07/26/2018 0620   GLUCOSE 103 (H) 07/27/2018 0604   GLUCOSE 120 (H) 07/26/2018 0620   CALCIUM 8.7 (L) 07/27/2018 0604   CALCIUM 8.6 (L) 07/26/2018 0620   AST 49 (H) 07/27/2018 0604   AST 31 07/24/2018 0627   ALT 28 07/27/2018 0604   ALT 29 07/24/2018 0627   ALKPHOS 152 (H) 07/27/2018 0604   ALKPHOS 138 (H) 07/24/2018 0627   BILITOT 1.2 07/27/2018 0604   BILITOT 0.9 07/24/2018 0627   PROT 5.7 (L) 07/27/2018 0604   PROT 5.5 (L) 07/24/2018 0627   ALBUMIN 2.2 (L) 07/27/2018 0604   ALBUMIN 2.1 (L) 07/24/2018 0627    Studies/Results: Ct Abdomen Pelvis W Contrast  Result Date: 07/26/2018 CLINICAL DATA:  Abdominal distention. Elevated white count. Anemia. EXAM: CT ABDOMEN AND PELVIS WITH CONTRAST TECHNIQUE: Multidetector CT imaging of the abdomen and pelvis was performed using the standard protocol following bolus administration of intravenous contrast. CONTRAST:  OMNIPAQUE IOHEXOL 300 MG/ML  SOLN COMPARISON:  07/18/2018 FINDINGS: Lower chest: Small bilateral pleural effusions are again noted left  greater than right. There is atelectasis and airspace consolidation in the left lower lobe. Hepatobiliary: No focal parenchymal abnormality identified within the liver. No intrahepatic bile duct dilatation. No common bile duct dilatation. The patient has undergone interval cholecystectomy. Pancreas: Unremarkable. No pancreatic ductal dilatation or surrounding inflammatory changes. Spleen: Normal in size without focal abnormality. Adrenals/Urinary Tract: Normal appearance of the adrenal glands. Left kidney cyst identified. No kidney mass or hydronephrosis identified. Duplicated left renal collecting system. Urinary bladder appears normal. Stomach/Bowel: No abnormal distension of the stomach. No small bowel dilatation. Moderate stool burden  involving the colon. Extensive colonic diverticulosis without acute inflammation. Vascular/Lymphatic: Normal appearance of the abdominal aorta. The hepatic veins and the portal veins appear patent. No adenopathy identified within the abdomen or pelvis. Reproductive: Uterus and bilateral adnexa are unremarkable. Other: Moderate amount of complex fluid within the upper abdomen measures 30.6 HU. This appears increased in volume when compared with 07/18/2018. Fluid within the upper abdomen posterior to the left lobe of liver and extending along the lesser curvature of the stomach into the left retroperitoneum measures 14.0 by 6.3 by 5.4 cm. This is contiguous with intermediate attenuation fluid in the gallbladder fossa which measures 4.7 by 7.6 by 4.0 cm. Fluid also extends from the gallbladder fossa along the falciform ligament over the anterior surface of the liver, image 25/2. Within the gallbladder fossa there is also a small more loculated area of fluid measuring 3.6 by 1.8 cm, image 57/4. 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 IMPRESSION: 1. There is a large volume of fluid identified within the upper abdomen which appears increased from MRI dated 07/18/2018. The fluid appears complicated and intermediate in attenuation. Primary differential considerations include hemorrhage versus biloma in this patient who is recently postop from cholecystectomy. If there is a clinical concern for active bowel leak recommend further evaluation with nuclear medicine hepatic biliary scan. Electronically Signed   By: Signa Kellaylor  [place]oud M.D.   On: 07/26/2018 16:02      Darnell Levelodd Luciano Cinquemani 07/27/2018  Patient ID: Kristen HarpBarbara Martinez, female   DOB: 1940/10/19, 78 y.o.   MRN: [id number]

## 2018-07-27 NOTE — Progress Notes (Signed)
PROGRESS NOTE  Kristen Martinez OEU:[id number] DOB: 09/01/1940 DOA: 07/17/2018 PCP: Patient, No Pcp Per   LOS: 10 days   Brief narrative: Patient is a25 year old female with history of dementia, type 2 diabetes, obstructive sleep apnea on CPAP who recently moved to [place] from [place] to live near to her daughter presented to the ED on 07/17/2018 with 2 days history of abdominal pain, nausea and multiple episodes of vomiting.  In the ED,patientwas found with elevated liver enzymes including elevated bilirubin, gallbladder stone with pericholecystic fluid as well as abnormal GE junction.  Surgery and GI were consulted  MRCP 1/30 did not show any evidence of biliary dilation but showed findings suggestive of cholecystitis. Patient most likely had CBD stone which she already passed,as evidenced by decreasing bilirubin level.Patient underwent laparoscopic cholecystectomyon 07/19/2018.  Per surgery note, patient had a gangrenous gallbladder with a large gallbladder stone which caused the gallbladder to press over the common bile duct causing obstructive jaundice (Mirrizzi syndrome).  On 2/7, CT abdomen and pelvis showed large complex fluid collection in upper abdomen- hemorrhage versus bilioma.  CCS initially placed request for IR drain placement.  CCS discussed with IR on 2/8, reportedly no evidence of ongoing biliary leak and hence no need for drain placement.  Surgery continues to follow.   Subjective: States that her breathing is "75% better".  Denies dyspnea.  Wheezing minimal and occasional.  Had a large normal BM.  No abdominal pain.  Assessment/Plan:  Principal Problem:   Acute calculous cholecystitis s/p lap cholecystectomy 07/19/2018 Active Problems:   Liver disease   Hyponatremia   Prolonged QT interval   OSA on CPAP   Pericardial effusion   Dementia (HCC)   Ascites   Elevated LFTs   Obesity (BMI 30-39.9)  Acute cholecystitis/cholelithiasis- status post lap chole. Pain  controlled.  CCS follow-up appreciated and state that she does not appear to have a bile leak, drainage appears to be old bloody fluid and recommend stopping antibiotics.  Drain removed 2/5.  Completed IV Zosyn 1/29-2/5. [facility] concerned about her increasing leukocytosis and requested CT abdomen-see below.  On 2/7, CT abdomen and pelvis showed large complex fluid collection in upper abdomen- hemorrhage versus bilioma.  CCS initially placed request for IR drain placement.  CCS discussed with IR on 2/8, reportedly no evidence of ongoing biliary leak and hence no need for drain placement.  Surgery continues to follow.  Elevated liver enzymes- After cholecystectomy, liver enzymes have almost normalized.  Acute intra-abdominal hemorrhage-MRCP 07/18/2018 showed an acute hemorrhage along the stomach tracks partially adjacent to the spleen, down to the left adrenal gland and to the left perirenalspace. Unclear etiology.  No evidence of perforation. No evidence of splenic artery aneurysm. Hemoglobin has been stable in the low 8 g range for the last 3 days.  CCS follow-up appreciated: Jamelle Haring placed over bleeding area of GB/liver 07/19/2018.  Anemia: Stable.  No overt bleeding.  Hypokalemia: Replaced.  Magnesium normal.  Low-grade temperature: Blood cultures x2: Negative.  Chest x-ray 2/3 showed left basilar airspace disease, suspect atelectasis rather than pneumonia given her symptomatology.  Leukocytosis is improved to 12.8.  Except low-grade temperature spike of 100.6 on 2/3, has essentially been afebrile.  Discontinued Zosyn and monitor. Ongoing mild leukocytosis/13.4, no significant fevers.  Her respiratory symptoms not suggestive clinically of pneumonia.  Repeated chest x-ray 2/6 and personally reviewed: Bibasilar atelectasis and?  Pulmonary congestion.  Acute bronchitis With clinical bronchospasm.?  Underlying history of reactive airway disease or COPD-needs to be followed up  with pulmonology.   Although prednisone allergy listed, unclear if true allergy or not.  Willing to try a dose of IV Solu-Medrol after explaining risks and benefits to patient and daughter at bedside.  Continue bronchodilator nebs.  Aggressive incentive spirometry.  Chest x-ray as above.  Also give a dose of Lasix 20 mg p.o. x1.  Tolerated Solu-Medrol.  Transitioned to Medrol dose taper beginning 2/8.  Continues to gradually improve.  Hyponatremia:Sodium level was low at 121 on admission.Suspect secondary to dehydration. Resolved.  Acute kidney injury:Creatinine peaked at 1.69.  Resolved.  Obstructive sleep apnea:On CPAP at night. Refuses to use it in the hospital.  Depression-continue Cymbalta  Type II DM: Continue SSI.  Monitor CBGs.  A1c 6.3 on 1/30.   DVT prophylaxis:SCDs Code Status:Full Family Communication:I discussed in detail with patient's daughter, updated care and answered questions. Disposition Plan:DC home pending clinical improvement, hopefully in the next day or 2.  Consultants:  GI  Surgery  Procedures:  Lap cholecystectomy on 07/19/2018  Antimicrobials:  Anti-infectives (From admission, onward)   Start     Dose/Rate Route Frequency Ordered Stop   07/18/18 0600  piperacillin-tazobactam (ZOSYN) IVPB 3.375 g  Status:  Discontinued     3.375 g 12.5 mL/hr over 240 Minutes Intravenous Every 8 hours 07/17/18 2008 07/24/18 1658   07/17/18 2030  piperacillin-tazobactam (ZOSYN) IVPB 3.375 g     3.375 g 100 mL/hr over 30 Minutes Intravenous  Once 07/17/18 2006 07/17/18 2242   07/17/18 1830  ciprofloxacin (CIPRO) IVPB 400 mg     400 mg 200 mL/hr over 60 Minutes Intravenous  Once 07/17/18 1822 07/17/18 2154      Infusions:    Scheduled Meds: . acetaminophen  1,000 mg Oral Q8H  . bisacodyl  10 mg Rectal Daily  . donepezil  10 mg Oral QHS  . DULoxetine  20 mg Oral QHS  . insulin aspart  0-9 Units Subcutaneous TID WC  . ipratropium-albuterol  3 mL Nebulization  TID  . methylPREDNISolone  4 mg Oral PC lunch  . methylPREDNISolone  4 mg Oral PC supper  . [START ON 07/28/2018] methylPREDNISolone  4 mg Oral 3 x daily with food  . [START ON 07/29/2018] methylPREDNISolone  4 mg Oral 4X daily taper  . methylPREDNISolone  8 mg Oral AC breakfast  . methylPREDNISolone  8 mg Oral Nightly  . [START ON 07/28/2018] methylPREDNISolone  8 mg Oral Nightly  . oxybutynin  5 mg Oral BID  . pantoprazole  40 mg Oral BID  . polyethylene glycol  17 g Oral BID  . senna  2 tablet Oral Daily    PRN meds: bisacodyl, HYDROmorphone, iohexol, ipratropium-albuterol, ondansetron (ZOFRAN) IV, oxyCODONE, phenol, traMADol   Objective: Vitals:   07/27/18 1224 07/27/18 1401  BP: (!) 120/51 (!) 152/75  Pulse: 78 79  Resp: 20 20  Temp:    SpO2: 99% 95%   Physical Exam: GENERAL: Pleasant elderly female, moderately built and obese sitting up comfortably in bed this morning.  Does not appear in any distress. CHEST: Occasional rhonchi anteriorly but otherwise clear to auscultation without crackles.  No increased work of breathing. CVS: S1 and S2 heard, RRR.  No JVD, murmurs or pedal edema. ABDOMEN: Abdomen appears distended but patient states that this is her usual size, soft and nontender.  No organomegaly or masses appreciated.  Normal bowel sounds heard.  Stable. EXTREMITIES: No edema.  Symmetric 5 x 5 power. CNS: Alert, awake, oriented x3.  No focal neurological deficits. SKIN:  warm and dry without rashes.  Data Review: I have personally reviewed the laboratory data and studies available.  Recent Labs  Lab 07/21/18 0539 07/22/18 0547 07/23/18 0620 07/24/18 0627 07/25/18 0612 07/26/18 0620 07/27/18 0604  WBC 16.1* 18.4* 17.1* 12.8* 13.4* 14.5* 14.4*  NEUTROABS 11.1* 11.6*  --  9.9* 10.5* 11.8*  --   HGB 8.5* 8.1* 8.0* 8.1* 8.0* 8.4* 8.0*  HCT 27.2* 26.9* 26.6* 27.2* 26.7* 27.8* 26.0*  MCV 88.6 90.6 91.7 91.0 91.1 92.4 91.5  PLT 315 309 338 417* 428* 468* 491*    Recent Labs  Lab 07/21/18 0539 07/22/18 0547 07/24/18 0627 07/26/18 0620 07/27/18 0604  NA 136 136 137 139 139  K 3.1* 3.4* 3.9 3.8 3.7  CL 106 107 104 105 105  CO2 23 22 26 28 26   GLUCOSE 117* 122* 122* 120* 103*  BUN 13 8 7* 9 11  CREATININE 0.97 0.86 0.93 0.88 0.83  CALCIUM 8.4* 8.3* 8.5* 8.6* 8.7*  MG  --   --  1.8  --   --     Recent Labs  Lab 07/21/18 0539 07/22/18 0547 07/23/18 0620 07/24/18 0627 07/27/18 0604  AST 49* 37 28 31 49*  ALT 61* 44 33 29 28  ALKPHOS 142* 136* 126 138* 152*  BILITOT 1.9* 1.2 1.1 0.9 1.2  PROT 5.5* 5.3* 5.5* 5.5* 5.7*  ALBUMIN 2.3* 2.1* 2.1* 2.1* 2.2*    Marcellus Scott, MD, [place], [facility]. [facility]  To contact the attending provider between 7A-7P or the covering provider during after hours 7P-7A, please log into the web site [email or web address] and access using universal [place] password for that web site. If you do not have the password, please call the hospital operator.

## 2018-07-28 LAB — CBC
HCT: 26.1 % — ABNORMAL LOW (ref 36.0–46.0)
Hemoglobin: 7.8 g/dL — ABNORMAL LOW (ref 12.0–15.0)
MCH: 27.1 pg (ref 26.0–34.0)
MCHC: 29.9 g/dL — ABNORMAL LOW (ref 30.0–36.0)
MCV: 90.6 fL (ref 80.0–100.0)
Platelets: 562 10*3/uL — ABNORMAL HIGH (ref 150–400)
RBC: 2.88 MIL/uL — ABNORMAL LOW (ref 3.87–5.11)
RDW: 15.9 % — ABNORMAL HIGH (ref 11.5–15.5)
WBC: 9.8 10*3/uL (ref 4.0–10.5)
nRBC: 0 % (ref 0.0–0.2)

## 2018-07-28 LAB — COMPREHENSIVE METABOLIC PANEL
ALT: 23 U/L (ref 0–44)
AST: 32 U/L (ref 15–41)
Albumin: 2.4 g/dL — ABNORMAL LOW (ref 3.5–5.0)
Alkaline Phosphatase: 148 U/L — ABNORMAL HIGH (ref 38–126)
Anion gap: 8 (ref 5–15)
BUN: 10 mg/dL (ref 8–23)
CO2: 25 mmol/L (ref 22–32)
Calcium: 8.5 mg/dL — ABNORMAL LOW (ref 8.9–10.3)
Chloride: 106 mmol/L (ref 98–111)
Creatinine, Ser: 0.75 mg/dL (ref 0.44–1.00)
GFR calc Af Amer: >60 mL/min (ref >60)
GFR calc non Af Amer: >60 mL/min (ref >60)
Glucose, Bld: 127 mg/dL — ABNORMAL HIGH (ref 70–99)
Potassium: 3.9 mmol/L (ref 3.5–5.1)
Sodium: 139 mmol/L (ref 135–145)
Total Bilirubin: 0.8 mg/dL (ref 0.3–1.2)
Total Protein: 5.8 g/dL — ABNORMAL LOW (ref 6.5–8.1)

## 2018-07-28 LAB — GLUCOSE, CAPILLARY
Glucose-Capillary: 120 mg/dL — ABNORMAL HIGH (ref 70–99)
Glucose-Capillary: 137 mg/dL — ABNORMAL HIGH (ref 70–99)
Glucose-Capillary: 155 mg/dL — ABNORMAL HIGH (ref 70–99)
Glucose-Capillary: 172 mg/dL — ABNORMAL HIGH (ref 70–99)

## 2018-07-28 MED ORDER — IPRATROPIUM-ALBUTEROL 0.5-2.5 (3) MG/3ML IN SOLN
3.0000 mL | Freq: Two times a day (BID) | RESPIRATORY_TRACT | Status: DC
  Administered 2018-07-29: 3 mL via RESPIRATORY_TRACT
  Filled 2018-07-28: qty 3

## 2018-07-28 MED ORDER — MIRTAZAPINE 15 MG PO TABS
15.0000 mg | ORAL_TABLET | Freq: Once | ORAL | Status: AC
  Administered 2018-07-28: 15 mg via ORAL
  Filled 2018-07-28: qty 1

## 2018-07-28 NOTE — Progress Notes (Signed)
PROGRESS NOTE  Kristen Martinez UOR:[id number] DOB: [birth year] DOA: 07/17/2018 PCP: Patient, No Pcp Per   LOS: 11 days   Brief narrative: Patient is a74 year old female with history of dementia, type 2 diabetes, obstructive sleep apnea on CPAP who recently moved to [place] from [place] to live near to her daughter presented to the ED on 07/17/2018 with 2 days history of abdominal pain, nausea and multiple episodes of vomiting.  In the ED,patientwas found with elevated liver enzymes including elevated bilirubin, gallbladder stone with pericholecystic fluid as well as abnormal GE junction.  Surgery and GI were consulted  MRCP 1/30 did not show any evidence of biliary dilation but showed findings suggestive of cholecystitis. Patient most likely had CBD stone which she already passed,as evidenced by decreasing bilirubin level.Patient underwent laparoscopic cholecystectomyon 07/19/2018.  Per surgery note, patient had a gangrenous gallbladder with a large gallbladder stone which caused the gallbladder to press over the common bile duct causing obstructive jaundice (Mirrizzi syndrome).  On 2/7, CT abdomen and pelvis showed large complex fluid collection in upper abdomen- hemorrhage versus bilioma.  CCS initially placed request for IR drain placement.  CCS discussed with IR on 2/8, reportedly no evidence of ongoing biliary leak and hence no need for drain placement.  Surgery continues to follow.   Subjective: Reports that her breathing is almost back to baseline.  No abdominal pain.  Tolerating diet.  Having BMs and refused laxatives this morning.  Assessment/Plan:  Principal Problem:   Acute calculous cholecystitis s/p lap cholecystectomy 07/19/2018 Active Problems:   Liver disease   Hyponatremia   Prolonged QT interval   OSA on CPAP   Pericardial effusion   Dementia (HCC)   Ascites   Elevated LFTs   Obesity (BMI 30-39.9)  Acute cholecystitis/cholelithiasis- status post lap chole.  Pain controlled.  CCS follow-up appreciated and state that she does not appear to have a bile leak, drainage appears to be old bloody fluid and recommend stopping antibiotics.  Drain removed 2/5.  Completed IV Zosyn 1/29-2/5. CCS concerned about her increasing leukocytosis and requested CT abdomen-see below.  On 2/7, CT abdomen and pelvis showed large complex fluid collection in upper abdomen- hemorrhage versus bilioma.  CCS initially placed request for IR drain placement.  CCS discussed with IR on 2/8, reportedly no evidence of ongoing biliary leak and hence no need for drain placement.  Surgery continues to follow.  Improved and stable.  CCS signed off and will arrange office visit in 2 weeks.  Elevated liver enzymes- After cholecystectomy, liver enzymes have normalized.  Acute intra-abdominal hemorrhage-MRCP 07/18/2018 showed an acute hemorrhage along the stomach tracks partially adjacent to the spleen, down to the left adrenal gland and to the left perirenalspace. Unclear etiology.  No evidence of perforation. No evidence of splenic artery aneurysm. Hemoglobin has been stable in the low 8 g range for the last 3 days.  CCS follow-up appreciated: Jamelle Haring placed over bleeding area of GB/liver 07/19/2018.  Anemia: Hemoglobin has been stable in the low 8 range for the last several days.  Today 7.8, minimally lower but no overt bleeding.  Asymptomatic.  Follow CBC in a.m.  Hypokalemia: Replaced.  Magnesium normal.  Acute bronchitis With clinical bronchospasm.?  Underlying history of reactive airway disease or COPD-needs to be followed up with pulmonology.  Although prednisone allergy listed, unclear if true allergy or not.  Willing to try a dose of IV Solu-Medrol after explaining risks and benefits to patient and daughter at bedside.  Continue bronchodilator nebs.  Aggressive incentive spirometry.  Chest x-ray as above.  Also give a dose of Lasix 20 mg p.o. x1.  Tolerated Solu-Medrol.  Transitioned  to Medrol dose taper beginning 2/8.  Improved.  Discussed with RN, wean off oxygen as tolerated.  Hyponatremia:Sodium level was low at 121 on admission.Suspect secondary to dehydration. Resolved.  Acute kidney injury:Creatinine peaked at 1.69.  Resolved.  Obstructive sleep apnea:On CPAP at night. Refuses to use it in the hospital.  Depression-continue Cymbalta  Type II DM: Continue SSI.  Monitor CBGs.  A1c 6.3 on 1/30.   DVT prophylaxis:SCDs Code Status:Full Family Communication:None at bedside today. Disposition Plan:DC home pending clinical improvement, hopefully 2/10.  Consultants:  GI  Surgery  Procedures:  Lap cholecystectomy on 07/19/2018  Antimicrobials:  Anti-infectives (From admission, onward)   Start     Dose/Rate Route Frequency Ordered Stop   07/18/18 0600  piperacillin-tazobactam (ZOSYN) IVPB 3.375 g  Status:  Discontinued     3.375 g 12.5 mL/hr over 240 Minutes Intravenous Every 8 hours 07/17/18 2008 07/24/18 1658   07/17/18 2030  piperacillin-tazobactam (ZOSYN) IVPB 3.375 g     3.375 g 100 mL/hr over 30 Minutes Intravenous  Once 07/17/18 2006 07/17/18 2242   07/17/18 1830  ciprofloxacin (CIPRO) IVPB 400 mg     400 mg 200 mL/hr over 60 Minutes Intravenous  Once 07/17/18 1822 07/17/18 2154      Infusions:    Scheduled Meds: . acetaminophen  1,000 mg Oral Q8H  . bisacodyl  10 mg Rectal Daily  . donepezil  10 mg Oral QHS  . DULoxetine  20 mg Oral QHS  . insulin aspart  0-9 Units Subcutaneous TID WC  . ipratropium-albuterol  3 mL Nebulization TID  . methylPREDNISolone  4 mg Oral 3 x daily with food  . [START ON 07/29/2018] methylPREDNISolone  4 mg Oral 4X daily taper  . methylPREDNISolone  8 mg Oral Nightly  . oxybutynin  5 mg Oral BID  . pantoprazole  40 mg Oral BID  . polyethylene glycol  17 g Oral BID  . senna  2 tablet Oral Daily    PRN meds: bisacodyl, HYDROmorphone, iohexol, ipratropium-albuterol,  menthol-cetylpyridinium, ondansetron (ZOFRAN) IV, oxyCODONE, phenol, traMADol   Objective: Vitals:   07/28/18 1528 07/28/18 1537  BP:  139/77  Pulse:  91  Resp:  18  Temp:  100.1 F (37.8 C)  SpO2: 94% 100%   Physical Exam: GENERAL: Pleasant elderly female, moderately built and obese sitting up comfortably in reclining chair this morning. CHEST: Clear to auscultation without wheezing, rhonchi or crackles.  No increased work of breathing. CVS: S1 and S2 heard, RRR.  No JVD, murmurs or pedal edema. ABDOMEN: Abdomen appears distended but patient states that this is her usual size, soft and nontender.  No organomegaly or masses appreciated.  Normal bowel sounds heard.  Stable. EXTREMITIES: No edema.  Symmetric 5 x 5 power. CNS: Alert, awake, oriented x3.  No focal neurological deficits. SKIN: warm and dry without rashes.  Data Review: I have personally reviewed the laboratory data and studies available.  Recent Labs  Lab 07/22/18 0547  07/24/18 0627 07/25/18 0612 07/26/18 0620 07/27/18 0604 07/28/18 0609  WBC 18.4*   < > 12.8* 13.4* 14.5* 14.4* 9.8  NEUTROABS 11.6*  --  9.9* 10.5* 11.8*  --   --   HGB 8.1*   < > 8.1* 8.0* 8.4* 8.0* 7.8*  HCT 26.9*   < > 27.2* 26.7* 27.8* 26.0* 26.1*  MCV 90.6   < >  91.0 91.1 92.4 91.5 90.6  PLT 309   < > 417* 428* 468* 491* 562*   < > = values in this interval not displayed.   Recent Labs  Lab 07/22/18 0547 07/24/18 0627 07/26/18 0620 07/27/18 0604 07/28/18 0609  NA 136 137 139 139 139  K 3.4* 3.9 3.8 3.7 3.9  CL 107 104 105 105 106  CO2 22 26 28 26 25   GLUCOSE 122* 122* 120* 103* 127*  BUN 8 7* 9 11 10   CREATININE 0.86 0.93 0.88 0.83 0.75  CALCIUM 8.3* 8.5* 8.6* 8.7* 8.5*  MG  --  1.8  --   --   --     Recent Labs  Lab 07/22/18 0547 07/23/18 0620 07/24/18 0627 07/27/18 0604 07/28/18 0609  AST 37 28 31 49* 32  ALT 44 33 29 28 23   ALKPHOS 136* 126 138* 152* 148*  BILITOT 1.2 1.1 0.9 1.2 0.8  PROT 5.3* 5.5* 5.5* 5.7* 5.8*   ALBUMIN 2.1* 2.1* 2.1* 2.2* 2.4*    Marcellus Scott, MD, [place], [facility]. [facility]  To contact the attending provider between 7A-7P or the covering provider during after hours 7P-7A, please log into the web site [email or web address] and access using universal [place] password for that web site. If you do not have the password, please call the hospital operator.

## 2018-07-28 NOTE — Progress Notes (Signed)
Assessment & Plan: POD#9 Laparoscopic cholecystectomy, 07/19/2018, Dr. Almond Martinez             Post op bleeding on MRI             No evidence of ongoing bile leak at present  Tolerating regular diet - denies abd pain - having BM's  WBC now normal; Hgb ~stable at 7.8; LFT's normal this AM!  Patient anticipating discharge on Monday 2/10 - agree from surgical standpoint  Will arrange follow up at [facility] office with Dr. Donell Martinez in 2 weeks        Kristen Level, MD       [facility]       Office: [phone]   Chief Complaint: Cholecystitis, post op complications  Subjective: Patient in bed, comfortable.  Passing flatus and having BM's.  Tolerating regular diet.  Denies abd pain.  Objective: Vital signs in last 24 hours: Temp:  [97.3 F (36.3 C)-98.7 F (37.1 C)] 98.7 F (37.1 C) (02/09 0542) Pulse Rate:  [69-83] 69 (02/09 0542) Resp:  [15-20] 19 (02/09 0542) BP: (120-152)/(51-79) 142/79 (02/09 0542) SpO2:  [95 %-100 %] 95 % (02/09 0741) Last BM Date: 07/28/18  Intake/Output from previous day: 02/08 0701 - 02/09 0700 In: 360 [P.O.:360] Out: 800 [Urine:800] Intake/Output this shift: No intake/output data recorded.  Physical Exam: HEENT - sclerae clear, mucous membranes moist Neck - soft Abdomen - soft, obese; non-tender; no mass; wounds dry and intact Ext - no edema, non-tender Neuro - alert & oriented, no focal deficits  Lab Results:  Recent Labs    07/27/18 0604 07/28/18 0609  WBC 14.4* 9.8  HGB 8.0* 7.8*  HCT 26.0* 26.1*  PLT 491* 562*   BMET Recent Labs    07/27/18 0604 07/28/18 0609  NA 139 139  K 3.7 3.9  CL 105 106  CO2 26 25  GLUCOSE 103* 127*  BUN 11 10  CREATININE 0.83 0.75  CALCIUM 8.7* 8.5*   PT/INR Recent Labs    07/27/18 0604  LABPROT 13.2  INR 1.01   Comprehensive Metabolic Panel:    Component Value Date/Time   NA 139 07/28/2018 0609   NA 139 07/27/2018 0604   K 3.9 07/28/2018 0609   K 3.7 07/27/2018  0604   CL 106 07/28/2018 0609   CL 105 07/27/2018 0604   CO2 25 07/28/2018 0609   CO2 26 07/27/2018 0604   BUN 10 07/28/2018 0609   BUN 11 07/27/2018 0604   CREATININE 0.75 07/28/2018 0609   CREATININE 0.83 07/27/2018 0604   GLUCOSE 127 (H) 07/28/2018 0609   GLUCOSE 103 (H) 07/27/2018 0604   CALCIUM 8.5 (L) 07/28/2018 0609   CALCIUM 8.7 (L) 07/27/2018 0604   AST 32 07/28/2018 0609   AST 49 (H) 07/27/2018 0604   ALT 23 07/28/2018 0609   ALT 28 07/27/2018 0604   ALKPHOS 148 (H) 07/28/2018 0609   ALKPHOS 152 (H) 07/27/2018 0604   BILITOT 0.8 07/28/2018 0609   BILITOT 1.2 07/27/2018 0604   PROT 5.8 (L) 07/28/2018 0609   PROT 5.7 (L) 07/27/2018 0604   ALBUMIN 2.4 (L) 07/28/2018 0609   ALBUMIN 2.2 (L) 07/27/2018 0604    Studies/Results: Ct Abdomen Pelvis W Contrast  Result Date: 07/26/2018 CLINICAL DATA:  Abdominal distention. Elevated white count. Anemia. EXAM: CT ABDOMEN AND PELVIS WITH CONTRAST TECHNIQUE: Multidetector CT imaging of the abdomen and pelvis was performed using the standard protocol following bolus administration of intravenous contrast. CONTRAST:  100mL OMNIPAQUE IOHEXOL 300 MG/ML  SOLN COMPARISON:  07/18/2018 FINDINGS: Lower chest: Small bilateral pleural effusions are again noted left greater than right. There is atelectasis and airspace consolidation in the left lower lobe. Hepatobiliary: No focal parenchymal abnormality identified within the liver. No intrahepatic bile duct dilatation. No common bile duct dilatation. The patient has undergone interval cholecystectomy. Pancreas: Unremarkable. No pancreatic ductal dilatation or surrounding inflammatory changes. Spleen: Normal in size without focal abnormality. Adrenals/Urinary Tract: Normal appearance of the adrenal glands. Left kidney cyst identified. No kidney mass or hydronephrosis identified. Duplicated left renal collecting system. Urinary bladder appears normal. Stomach/Bowel: No abnormal distension of the stomach. No  small bowel dilatation. Moderate stool burden involving the colon. Extensive colonic diverticulosis without acute inflammation. Vascular/Lymphatic: Normal appearance of the abdominal aorta. The hepatic veins and the portal veins appear patent. No adenopathy identified within the abdomen or pelvis. Reproductive: Uterus and bilateral adnexa are unremarkable. Other: Moderate amount of complex fluid within the upper abdomen measures 30.6 HU. This appears increased in volume when compared with 07/18/2018. Fluid within the upper abdomen posterior to the left lobe of liver and extending along the lesser curvature of the stomach into the left retroperitoneum measures 14.0 by 6.3 by 5.4 cm. This is contiguous with intermediate attenuation fluid in the gallbladder fossa which measures 4.7 by 7.6 by 4.0 cm. Fluid also extends from the gallbladder fossa along the falciform ligament over the anterior surface of the liver, image 25/2. Within the gallbladder fossa there is also a small more loculated area of fluid measuring 3.6 by 1.8 cm, image 57/4. 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 IMPRESSION: 1. There is a large volume of fluid identified within the upper abdomen which appears increased from MRI dated 07/18/2018. The fluid appears complicated and intermediate in attenuation. Primary differential considerations include hemorrhage versus biloma in this patient who is recently postop from cholecystectomy. If there is a clinical concern for active bowel leak recommend further evaluation with nuclear medicine hepatic biliary scan. Electronically Signed   By: Signa Kellaylor  [place] M.D.   On: 07/26/2018 16:02      Kristen Martinez 07/28/2018  Patient ID: Kristen Martinez, female   DOB: 02/26/1941, 78 y.o.   MRN: [id number]

## 2018-07-29 DIAGNOSIS — T8189XS Other complications of procedures, not elsewhere classified, sequela: Secondary | ICD-10-CM

## 2018-07-29 DIAGNOSIS — E119 Type 2 diabetes mellitus without complications: Secondary | ICD-10-CM

## 2018-07-29 DIAGNOSIS — T8189XA Other complications of procedures, not elsewhere classified, initial encounter: Secondary | ICD-10-CM

## 2018-07-29 DIAGNOSIS — K668 Other specified disorders of peritoneum: Secondary | ICD-10-CM

## 2018-07-29 LAB — CBC
HCT: 26.1 % — ABNORMAL LOW (ref 36.0–46.0)
Hemoglobin: 7.9 g/dL — ABNORMAL LOW (ref 12.0–15.0)
MCH: 27.3 pg (ref 26.0–34.0)
MCHC: 30.3 g/dL (ref 30.0–36.0)
MCV: 90.3 fL (ref 80.0–100.0)
Platelets: 569 10*3/uL — ABNORMAL HIGH (ref 150–400)
RBC: 2.89 MIL/uL — ABNORMAL LOW (ref 3.87–5.11)
RDW: 15.9 % — ABNORMAL HIGH (ref 11.5–15.5)
WBC: 10.7 10*3/uL — ABNORMAL HIGH (ref 4.0–10.5)
nRBC: 0 % (ref 0.0–0.2)

## 2018-07-29 LAB — GLUCOSE, CAPILLARY
Glucose-Capillary: 139 mg/dL — ABNORMAL HIGH (ref 70–99)
Glucose-Capillary: 176 mg/dL — ABNORMAL HIGH (ref 70–99)
Glucose-Capillary: 120 mg/dL — ABNORMAL HIGH (ref 70–99)

## 2018-07-29 MED ORDER — METHYLPREDNISOLONE 4 MG PO TBPK: ORAL_TABLET | ORAL | 0 refills | Status: DC

## 2018-07-29 MED ORDER — ACETAMINOPHEN 325 MG PO TABS: 650.0000 mg | ORAL_TABLET | Freq: Four times a day (QID) | ORAL | Status: DC | PRN

## 2018-07-29 MED ORDER — ALBUTEROL SULFATE HFA 108 (90 BASE) MCG/ACT IN AERS: 2.0000 | INHALATION_SPRAY | Freq: Four times a day (QID) | RESPIRATORY_TRACT | 0 refills | Status: DC | PRN

## 2018-07-29 NOTE — Progress Notes (Signed)
SATURATION QUALIFICATIONS: (This note is used to comply with regulatory documentation for home oxygen)  Patient Saturations on Room Air at Rest = 100%  Patient Saturations on Room Air while Ambulating = 82%  Patient Saturations on 2 Liters of oxygen while Ambulating = 96%  Please briefly explain why patient needs home oxygen: patient oxygen levels dropped to 82% while ambulating.

## 2018-07-29 NOTE — Discharge Summary (Signed)
Physician Discharge Summary  Lorretta HarpBarbara Shaver WUJ:[id number] DOB: 1940/10/31  PCP: Juluis RainierBarnes, Elizabeth, MD  Admit date: 07/17/2018 Discharge date: 07/29/2018  Recommendations for Outpatient Follow-up:  1. Dr. Juluis RainierElizabeth Barnes, PCP in 1 week with repeat labs (CBC & CMP). 2. [facility] on 08/06/2018 at 11:30 PM for postop follow-up. 3. Recommend repeating chest x-ray in 4 weeks to ensure resolution of abnormal findings noted below. 4. Consider outpatient Pulmonology consultation for evaluation of possible COPD.   Home Health: PT & OT Equipment/Devices: Rolling walker with 5 inch wheels, 3 n 1 and oxygen via nasal cannula at 2 L/min continuously.  Discharge Condition: Improved and stable CODE STATUS: Full Diet recommendation: Heart healthy & diabetic diet.  Discharge Diagnoses:  Principal Problem:   Acute calculous cholecystitis s/p lap cholecystectomy 07/19/2018 Active Problems:   Liver disease   Hyponatremia   Prolonged QT interval   OSA on CPAP   Pericardial effusion   Dementia (HCC)   Ascites   Elevated LFTs   Obesity (BMI 30-39.9)   Biloma following surgery   Brief Summary: Patient is a[age] year old female with history of dementia, type 2 diabetes diet controlled, obstructive sleep apnea on CPAP who recently moved to [place] from [place] to live near to her daughter presented to theED on 1/29/2020with 2 days history of abdominal pain, nausea and multiple episodes of vomiting.  In the ED,patientwas found with elevated liver enzymes including elevated bilirubin, gallbladder stone with pericholecystic fluid as well as abnormal GE junction. Surgery and GI were consulted  MRCP1/30did not show any evidence of biliary dilation but showed findings suggestive of cholecystitis. Patient most likely had CBD stone which she already passed,as evidenced by decreasing bilirubin level.Patient underwent laparoscopic cholecystectomyon 07/19/2018.  Per surgery note, patient  had a gangrenous gallbladder with a large gallbladder stone which caused the gallbladder to press over the common bile duct causing obstructive jaundice (Mirrizzi syndrome).  On 2/7, CT abdomen and pelvis showed large complex fluid collection in upper abdomen- hemorrhage versus bilioma.  CCS initially placed request for IR drain placement.  CCS discussed with IR on 2/8, reportedly no evidence of ongoing biliary leak and hence no need for drain placement.    Assessment/Plan:  Acute cholecystitis/cholelithiasis- status post lap cholecystectomy. Pain resolved.  Drain removed 2/5.  Completed IV Zosyn 1/29-2/5. CCS concerned about her increasing leukocytosis and requested CT abdomen-see below.   On 2/7, CT abdomen and pelvis showed large complex fluid collection in upper abdomen- hemorrhage versus bilioma.  CCS initially placed request for IR drain placement.  CCS discussed with IR on 2/8, reportedly no evidence of ongoing biliary leak and hence no need for drain placement. Improved and stable.  CCS signed off and arranged office visit in 2 weeks.  Elevated liver enzymes- After cholecystectomy, liver enzymes have normalized.  Acute intra-abdominal hemorrhage-MRCP 07/18/2018 showed an acute hemorrhage along the stomach tracks partially adjacent to the spleen, down to the left adrenal gland and to the left perirenalspace. Unclear etiology.  No evidence of perforation. No evidence of splenic artery aneurysm. Hemoglobin stable.  CCS follow-up appreciated: Jamelle HaringSnow placed over bleeding area of GB/liver 07/19/2018.  Anemia:  Due to intra-abdominal hemorrhage, in the hospital phlebotomies and laparoscopic cholecystectomy.  Hemoglobin was stable in the low 8 g range for several days and then dropped to 7.8 yesterday but again stable at 7.9 today.  Asymptomatic.  No overt bleeding.  Closely follow CBC as outpatient.  Hypokalemia: Replaced.  Magnesium normal.  Acute bronchitis With clinical  bronchospasm.?  Underlying history of reactive airway disease or COPD-needs to be followed up with pulmonology.  Although prednisone allergy listed, unclear if true allergy or not.  Treated initially with a couple days of IV Solu-Medrol which she tolerated and with bronchodilator nebs.  Aggressive incentive spirometry.  Also gave a dose of Lasix 20 mg p.o. x1. Transitioned to Medrol dose taper beginning 2/8.  Improved and clinical bronchospasm has resolved.  Acute respiratory failure with hypoxia Likely related to acute bronchitis over?  COPD.  Not hypoxic at rest but hypoxic with activity and qualified for home oxygen.  Hyponatremia:Sodium level was low at 121 on admission.Suspect secondary to dehydration. Resolved.  Acute kidney injury:Creatinine peaked at 1.69.  Resolved.  Obstructive sleep apnea:On CPAP at night. Refuses to use it in the hospital.  Depression-continue Cymbalta  Type II DM: A1c 6.3 on 1/30.  Diet controlled PTA.   Consultants:  GI  Surgery  Procedures:  Lap cholecystectomy on 07/19/2018  Discharge Instructions  Discharge Instructions    Call MD for:  difficulty breathing, headache or visual disturbances   Complete by:  As directed    Call MD for:  extreme fatigue   Complete by:  As directed    Call MD for:  persistant dizziness or light-headedness   Complete by:  As directed    Call MD for:  persistant nausea and vomiting   Complete by:  As directed    Call MD for:  redness, tenderness, or signs of infection (pain, swelling, redness, odor or green/yellow discharge around incision site)   Complete by:  As directed    Call MD for:  severe uncontrolled pain   Complete by:  As directed    Call MD for:  temperature >100.4   Complete by:  As directed    Diet - low sodium heart healthy   Complete by:  As directed    Diet Carb Modified   Complete by:  As directed    Increase activity slowly   Complete by:  As directed        Medication  List    TAKE these medications   acetaminophen 325 MG tablet Commonly known as:  TYLENOL Take 2 tablets (650 mg total) by mouth every 6 (six) hours as needed for mild pain, moderate pain or fever.   albuterol 108 (90 Base) MCG/ACT inhaler Commonly known as:  PROVENTIL HFA;VENTOLIN HFA Inhale 2 puffs into the lungs every 6 (six) hours as needed for wheezing or shortness of breath.   calcium carbonate 500 MG chewable tablet Commonly known as:  TUMS - dosed in mg elemental calcium Chew 1 tablet by mouth at bedtime.   donepezil 10 MG tablet Commonly known as:  ARICEPT Take 10 mg by mouth at bedtime.   DULoxetine 20 MG capsule Commonly known as:  CYMBALTA Take 20 mg by mouth at bedtime.   methylPREDNISolone 4 MG Tbpk tablet Commonly known as:  MEDROL DOSEPAK Complete dosepak that was started in the hospital and given to you at discharge.   mirtazapine 15 MG tablet Commonly known as:  REMERON Take 15 mg by mouth at bedtime.   omeprazole 20 MG capsule Commonly known as:  PRILOSEC Take 20 mg by mouth at bedtime.   oxybutynin 5 MG tablet Commonly known as:  DITROPAN Take 5 mg by mouth 2 (two) times daily.      Follow-up Information    [facility], [place]. Go on 08/06/2018.   Specialty:  General Surgery Why:  Your appointment  is 02/18 at 11:30 am Please arrive 30 minutes prior to your appointment to check in and fill out paperwork. Bring photo ID and insurance information. Contact information: [address] [place] [address] [phone]       Juluis Rainier, MD. Schedule an appointment as soon as possible for a visit in 1 week(s).   Specialty:  Family Medicine Why:  To be seen with repeat labs (CBC & CMP). Contact information: [address] [place] [address] [phone]          Allergies  Allergen Reactions  . Penicillins Nausea And Vomiting    Did it involve swelling of the face/tongue/throat, SOB,  or low BP? No Did it involve sudden or severe rash/hives, skin peeling, or any reaction on the inside of your mouth or nose? No Did you need to seek medical attention at a hospital or doctor's office? Yes When did it last happen?more than 10 years ago as of present date January 2020 If all above answers are "NO", may proceed with cephalosporin use.  Pt does report she experiences a particular taste in her mouth   . Prednisone     Tongue swelling      Procedures/Studies: Dg Chest 2 View  Result Date: 07/25/2018 CLINICAL DATA:  Dementia, type II diabetes mellitus, obstructive sleep apnea on CPAP, 2 day history of abdominal pain, nausea, and vomiting, abnormal LFTs EXAM: CHEST - 2 VIEW COMPARISON:  07/22/2018 FINDINGS: Enlargement of cardiac silhouette. Mediastinal contours and pulmonary vascularity normal. Minimal RIGHT basilar atelectasis. More confluent opacification at the LEFT lower lobe either represents pneumonia or atelectasis, associated with a small LEFT pleural effusion. No pneumothorax. IMPRESSION: Enlargement of cardiac silhouette with RIGHT basilar atelectasis. LEFT lower lobe atelectasis versus consolidation with associated small LEFT pleural effusion. Electronically Signed   By: Ulyses Southward M.D.   On: 07/25/2018 10:32   Dg Chest 2 View  Result Date: 07/22/2018 CLINICAL DATA:  Cough and elevated white blood cell count. EXAM: CHEST - 2 VIEW COMPARISON:  PA and lateral chest 07/17/2018. FINDINGS: There is left basilar airspace disease. Right lung is clear. Heart size is upper normal. No pneumothorax. There is likely a small left pleural effusion. No acute or focal bony abnormality. IMPRESSION: Left basilar airspace disease and likely small effusion worrisome for pneumonia. Electronically Signed   By: Drusilla Kanner M.D.   On: 07/22/2018 11:08   Dg Chest 2 View  Result Date: 07/17/2018 CLINICAL DATA:  Initial evaluation for acute shortness of breath. EXAM: CHEST - 2 VIEW  COMPARISON:  None. FINDINGS: Cardiac and mediastinal silhouettes within normal limits. Lungs hypoinflated. Streaky bibasilar opacities, most consistent with atelectasis. No consolidative opacity. No edema or pleural effusion. No pneumothorax. No acute osseous finding. Degenerative changes noted about the shoulders bilaterally. IMPRESSION: 1. Shallow lung inflation with streaky bibasilar atelectatic changes. 2. No other active cardiopulmonary disease. Electronically Signed   By: Rise Mu M.D.   On: 07/17/2018 13:25   US Abdomen Complete  Result Date: 07/17/2018 CLINICAL DATA:  Abdominal pain EXAM: ABDOMEN ULTRASOUND COMPLETE COMPARISON:  None. FINDINGS: Gallbladder: Gallbladder sludge with large calculus measuring 5 cm. No pericholecystic fluid. There is wall thickening measuring up to 10 mm fall on the hepatic margin. A negative sonographic Eulah Pont sign was reported by the sonographer. Common bile duct: Diameter: 4 mm Liver: Coarse hepatic echotexture. Small volume perihepatic ascites adjacent to the left hepatic lobe. There is hepatopetal blood flow within the portal vein. Small caliber  adjacent vessels may indicate cavernous transformation. IVC: No abnormality visualized. Pancreas: Poorly visualized due to body habitus. Spleen: Size and appearance within normal limits. Right Kidney: Length: 11.9 cm. Echogenicity within normal limits. No mass or hydronephrosis visualized. Left Kidney: Length: 12.3 cm. Echogenicity within normal limits. No mass or hydronephrosis visualized. There is a parapelvic cyst measuring 2.6 x 2.3 x 2.0 cm Abdominal aorta: Poorly visualized due to bowel gas. No visible aneurysm. Other findings: None. IMPRESSION: 1. Coarse hepatic echotexture with small amount of perihepatic ascites. This may indicate chronic liver disease, including hepatic cirrhosis. 2. Hepatopetal blood flow demonstrated within the portal vein. Small caliber nearby vessels may be a sign of cavernous  transformation. Correlation with abdominal MRI with and without contrast might be helpful. 3. Gallbladder sludge with large calculus and wall thickening. Negative sonographic Murphy sign. Electronically Signed   By: Deatra RobinsonKevin  Herman M.D.   On: 07/17/2018 17:48   Ct Abdomen Pelvis W Contrast  Result Date: 07/26/2018 CLINICAL DATA:  Abdominal distention. Elevated white count. Anemia. EXAM: CT ABDOMEN AND PELVIS WITH CONTRAST TECHNIQUE: Multidetector CT imaging of the abdomen and pelvis was performed using the standard protocol following bolus administration of intravenous contrast. CONTRAST:  100mL OMNIPAQUE IOHEXOL 300 MG/ML  SOLN COMPARISON:  07/18/2018 FINDINGS: Lower chest: Small bilateral pleural effusions are again noted left greater than right. There is atelectasis and airspace consolidation in the left lower lobe. Hepatobiliary: No focal parenchymal abnormality identified within the liver. No intrahepatic bile duct dilatation. No common bile duct dilatation. The patient has undergone interval cholecystectomy. Pancreas: Unremarkable. No pancreatic ductal dilatation or surrounding inflammatory changes. Spleen: Normal in size without focal abnormality. Adrenals/Urinary Tract: Normal appearance of the adrenal glands. Left kidney cyst identified. No kidney mass or hydronephrosis identified. Duplicated left renal collecting system. Urinary bladder appears normal. Stomach/Bowel: No abnormal distension of the stomach. No small bowel dilatation. Moderate stool burden involving the colon. Extensive colonic diverticulosis without acute inflammation. Vascular/Lymphatic: Normal appearance of the abdominal aorta. The hepatic veins and the portal veins appear patent. No adenopathy identified within the abdomen or pelvis. Reproductive: Uterus and bilateral adnexa are unremarkable. Other: Moderate amount of complex fluid within the upper abdomen measures 30.6 HU. This appears increased in volume when compared with 07/18/2018.  Fluid within the upper abdomen posterior to the left lobe of liver and extending along the lesser curvature of the stomach into the left retroperitoneum measures 14.0 by 6.3 by 5.4 cm. This is contiguous with intermediate attenuation fluid in the gallbladder fossa which measures 4.7 by 7.6 by 4.0 cm. Fluid also extends from the gallbladder fossa along the falciform ligament over the anterior surface of the liver, image 25/2. Within the gallbladder fossa there is also a small more loculated area of fluid measuring 3.6 by 1.8 cm, image 57/4. 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 IMPRESSION: 1. There is a large volume of fluid identified within the upper abdomen which appears increased from MRI dated 07/18/2018. The fluid appears complicated and intermediate in attenuation. Primary differential considerations include hemorrhage versus biloma in this patient who is recently postop from cholecystectomy. If there is a clinical concern for active bowel leak recommend further evaluation with nuclear medicine hepatic biliary scan. Electronically Signed   By: Signa Kellaylor  Stroud M.D.   On: 07/26/2018 16:02   Ct Abdomen Pelvis W Contrast  Result Date: 07/17/2018 CLINICAL DATA:  Abdomen pain EXAM: CT ABDOMEN AND PELVIS WITH CONTRAST TECHNIQUE: Multidetector CT imaging of the abdomen and pelvis was performed using  the standard protocol following bolus administration of intravenous contrast. CONTRAST:  ISOVUE-300 IOPAMIDOL (ISOVUE-300) INJECTION 61% COMPARISON:  Ultrasound 07/17/2018 FINDINGS: Lower chest: Lung bases demonstrate trace pleural effusions. Patchy atelectasis within the bilateral lung bases. Mild bronchial wall thickening. Borderline to mild cardiomegaly with trace pericardial effusion. Hepatobiliary: No focal hepatic abnormality. Fluid and edema along the falciform ligament. Distended gallbladder with faint stone. Wall thickening and surrounding pericholecystic fluid and inflammatory  change. Pancreas: No ductal dilatation. Spleen: Normal in size without focal abnormality. Adrenals/Urinary Tract: Adrenal glands are normal. No hydronephrosis. Cyst in the mid to lower left kidney. The bladder is normal. Stomach/Bowel: Very narrowed appearing GE junction. Large volume of fluid surrounding the GE junction and along the lesser curvature of the stomach. Mild antral wall thickening. No dilated small bowel. 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 Vascular/Lymphatic: Nonaneurysmal aorta.  No significant adenopathy. Reproductive: Uterus and bilateral adnexa are unremarkable. Other: Negative for free air.  Free fluid within the upper abdomen. Musculoskeletal: Degenerative changes without acute or suspicious abnormality. IMPRESSION: 1. Unusual constellation of findings. Moderate to large amount of fluid and edema at the GE junction and along the lesser curvature of the stomach; GE junction appears narrowed and wisp like. Findings could be secondary to micro perforation at the GE junction vs peptic ulcer disease, however no extraluminal gas is visualized. Endoscopy could be considered. Pancreatitis is also considered but felt less likely given the distribution of fluid and lack of edema/fluid immediately adjacent to the pancreas. Note that there is abnormal appearance of the gallbladder with large stone, gallbladder wall thickening and pericholecystic fluid/edema suggesting possible cholecystitis. It would be unusual for gallbladder inflammatory change to account for the moderate to large volume of fluid near the stomach 2. Sigmoid colon diverticular disease without acute inflammatory change. 3. Trace pleural effusions. Atelectasis at both bases. Mild bronchial wall thickening at the lower lobes. Electronically Signed   By: Jasmine Pang M.D.   On: 07/17/2018 20:54   Mr Abdomen Mrcp Wo Contrast  Result Date: 07/18/2018 CLINICAL DATA:  Cholecystitis, cholelithiasis, fluid along  the gastric wall and attic margin at CT, for further characterization. EXAM: MRI ABDOMEN WITHOUT CONTRAST  (INCLUDING MRCP) TECHNIQUE: Multiplanar multisequence MR imaging of the abdomen was performed. Heavily T2-weighted images of the biliary and pancreatic ducts were obtained, and three-dimensional MRCP images were rendered by post processing. COMPARISON:  CT scan dated 07/17/2018 IV contrast could not be administered due to renal insufficiency. FINDINGS: Lower chest: Small bilateral pleural effusions. Mild cardiomegaly. Mild atelectasis in both lung bases. Hepatobiliary: Mild perihepatic ascites. Mild periportal edema carried no focal liver lesion is identified on today's noncontrast MRI. No biliary dilatation is observed. This 5.1 cm in long axis gallstone in the gallbladder along with layering sludge in the gallbladder. Gallbladder wall thickening is present. Pancreas: Trace peripancreatic stranding is thought to be from extrinsic causes rather than acute pancreatitis, although correlation with lipase levels would be suggested. No dorsal pancreatic duct dilatation. Spleen:  Unremarkable Adrenals/Urinary Tract: Although some of the hemorrhagic or proteinaceous fluid from around the stomach tracks around the lateral limb of the left adrenal gland, I am skeptical that the left adrenal gland is the source of the hemorrhage. There is some tracking of blood products along the top of the left perirenal space and down the retrorenal space. The kidney does not appear to be the epicenter of this process. Suspected cyst of the left mid kidney anteriorly. Stomach/Bowel: There is infiltrative fluid density  with high T1 signal especially along the margins of the stomach including the lesser sac margin and inferior margin as well as the posterior margin. This also tracks up partially adjacent to the spleen, left adrenal gland, and around the left perirenal space margins as noted above. The gastroesophageal junction is  surrounded by some of this complex fluid, which also tracks down around the falciform ligament and anterior margin of the lateral segment left hepatic lobe. The appearance favors hemorrhage given the high precontrast T1 signal. I do not identify a discrete mass of the distal esophagus or gastric wall or of the adrenal gland 2 account for this apparent acute hemorrhage. I do not see findings of extraluminal gas to suggest a perforated ulcer. There is a considerable degree of colonic diverticulosis but I do not see active diverticulitis in the upper abdomen. Vascular/Lymphatic: No pathologic adenopathy. Vascular contours appear normal. On the recent CT scan I do not observe definite active extravasation of contrast. The portal vein and its tributaries are mildly narrowed but without cavernous transformation. Other: In addition to the complex fluid noted above, there is a small amount of perihepatic ascites which is not complex. Musculoskeletal: Unremarkable IMPRESSION: 1. Abnormal complex fluid with high precontrast T1 signal favoring acute hemorrhage along the margin of the stomach, gastroesophageal junction, and tracking around the left hepatic lobe and falciform ligament. Some of this process also tracks around the left perirenal space down into the left retro renal space. The origin of the acute hemorrhage is not identified although the epicenter of the process is probably along the proximal gastric wall. Today's exam does not have IV contrast, but the prior CT scan did not appear to show active extravasation of contrast in the amount of hemorrhage appears similar to last night's exam. No extraluminal gas to suggest a perforated ulcer. The exact cause and origin of the hemorrhage is uncertain. 2. No overt nodularity of the liver or definite morphologic findings of cirrhosis. 3. Gallbladder wall thickening with cholelithiasis and sludge-correlate clinically in assessing for acute cholecystitis. 4. Small bilateral  pleural effusions with mild cardiomegaly. 5. In addition to the above-noted hemorrhage, there is some mild simple perihepatic ascites which does not include blood products. 6. Considerable colonic diverticulosis. Electronically Signed   By: Gaylyn Rong M.D.   On: 07/18/2018 13:39   Mr 3d Recon At Scanner  Result Date: 07/18/2018 CLINICAL DATA:  Cholecystitis, cholelithiasis, fluid along the gastric wall and attic margin at CT, for further characterization. EXAM: MRI ABDOMEN WITHOUT CONTRAST  (INCLUDING MRCP) TECHNIQUE: Multiplanar multisequence MR imaging of the abdomen was performed. Heavily T2-weighted images of the biliary and pancreatic ducts were obtained, and three-dimensional MRCP images were rendered by post processing. COMPARISON:  CT scan dated 07/17/2018 IV contrast could not be administered due to renal insufficiency. FINDINGS: Lower chest: Small bilateral pleural effusions. Mild cardiomegaly. Mild atelectasis in both lung bases. Hepatobiliary: Mild perihepatic ascites. Mild periportal edema carried no focal liver lesion is identified on today's noncontrast MRI. No biliary dilatation is observed. This 5.1 cm in long axis gallstone in the gallbladder along with layering sludge in the gallbladder. Gallbladder wall thickening is present. Pancreas: Trace peripancreatic stranding is thought to be from extrinsic causes rather than acute pancreatitis, although correlation with lipase levels would be suggested. No dorsal pancreatic duct dilatation. Spleen:  Unremarkable Adrenals/Urinary Tract: Although some of the hemorrhagic or proteinaceous fluid from around the stomach tracks around the lateral limb of the left adrenal gland, I am skeptical that  the left adrenal gland is the source of the hemorrhage. There is some tracking of blood products along the top of the left perirenal space and down the retrorenal space. The kidney does not appear to be the epicenter of this process. Suspected cyst of the  left mid kidney anteriorly. Stomach/Bowel: There is infiltrative fluid density with high T1 signal especially along the margins of the stomach including the lesser sac margin and inferior margin as well as the posterior margin. This also tracks up partially adjacent to the spleen, left adrenal gland, and around the left perirenal space margins as noted above. The gastroesophageal junction is surrounded by some of this complex fluid, which also tracks down around the falciform ligament and anterior margin of the lateral segment left hepatic lobe. The appearance favors hemorrhage given the high precontrast T1 signal. I do not identify a discrete mass of the distal esophagus or gastric wall or of the adrenal gland 2 account for this apparent acute hemorrhage. I do not see findings of extraluminal gas to suggest a perforated ulcer. There is a considerable degree of colonic diverticulosis but I do not see active diverticulitis in the upper abdomen. Vascular/Lymphatic: No pathologic adenopathy. Vascular contours appear normal. On the recent CT scan I do not observe definite active extravasation of contrast. The portal vein and its tributaries are mildly narrowed but without cavernous transformation. Other: In addition to the complex fluid noted above, there is a small amount of perihepatic ascites which is not complex. Musculoskeletal: Unremarkable IMPRESSION: 1. Abnormal complex fluid with high precontrast T1 signal favoring acute hemorrhage along the margin of the stomach, gastroesophageal junction, and tracking around the left hepatic lobe and falciform ligament. Some of this process also tracks around the left perirenal space down into the left retro renal space. The origin of the acute hemorrhage is not identified although the epicenter of the process is probably along the proximal gastric wall. Today's exam does not have IV contrast, but the prior CT scan did not appear to show active extravasation of contrast in  the amount of hemorrhage appears similar to last night's exam. No extraluminal gas to suggest a perforated ulcer. The exact cause and origin of the hemorrhage is uncertain. 2. No overt nodularity of the liver or definite morphologic findings of cirrhosis. 3. Gallbladder wall thickening with cholelithiasis and sludge-correlate clinically in assessing for acute cholecystitis. 4. Small bilateral pleural effusions with mild cardiomegaly. 5. In addition to the above-noted hemorrhage, there is some mild simple perihepatic ascites which does not include blood products. 6. Considerable colonic diverticulosis. Electronically Signed   By: Gaylyn Rong M.D.   On: 07/18/2018 13:39      Subjective: Patient denies complaints.  Indicates that her dyspnea has resolved and breathing is almost back to baseline.  No cough, chest pain.  Tolerating diet without abdominal pain, nausea or vomiting.  Having BMs.  No overt bleeding reported.  As per RN, no acute issues noted.  Discharge Exam:  Vitals:   07/29/18 0330 07/29/18 0544 07/29/18 0812 07/29/18 1408  BP:  (!) 146/70  (!) 133/96  Pulse:  78  76  Resp:  19  18  Temp:  99 F (37.2 C)  99.5 F (37.5 C)  TempSrc:  Oral  Oral  SpO2: 94% 97% 97% 100%  Weight:      Height:        GENERAL: Pleasant elderly female, moderately built and obese sitting up comfortably in reclining chair this morning. CHEST: Clear to  auscultation without wheezing, rhonchi or crackles.  No increased work of breathing. CVS: S1 and S2 heard, RRR.  No JVD, murmurs or pedal edema. ABDOMEN: Abdomen nondistended/obese, soft and nontender.  No organomegaly or masses appreciated.  Normal bowel sounds heard.   EXTREMITIES: No edema.  Symmetric 5 x 5 power. CNS: Alert, awake, oriented x3.  No focal neurological deficits. SKIN: warm and dry without rashes.    The results of significant diagnostics from this hospitalization (including imaging, microbiology, ancillary and laboratory) are  listed below for reference.      Labs: CBC: Recent Labs  Lab 07/24/18 0627 07/25/18 0612 07/26/18 0620 07/27/18 0604 07/28/18 0609 07/29/18 0610  WBC 12.8* 13.4* 14.5* 14.4* 9.8 10.7*  NEUTROABS 9.9* 10.5* 11.8*  --   --   --   HGB 8.1* 8.0* 8.4* 8.0* 7.8* 7.9*  HCT 27.2* 26.7* 27.8* 26.0* 26.1* 26.1*  MCV 91.0 91.1 92.4 91.5 90.6 90.3  PLT 417* 428* 468* 491* 562* 569*   Basic Metabolic Panel: Recent Labs  Lab 07/24/18 0627 07/26/18 0620 07/27/18 0604 07/28/18 0609  NA 137 139 139 139  K 3.9 3.8 3.7 3.9  CL 104 105 105 106  CO2 26 28 26 25   GLUCOSE 122* 120* 103* 127*  BUN 7* 9 11 10   CREATININE 0.93 0.88 0.83 0.75  CALCIUM 8.5* 8.6* 8.7* 8.5*  MG 1.8  --   --   --    Liver Function Tests: Recent Labs  Lab 07/23/18 0620 07/24/18 0627 07/27/18 0604 07/28/18 0609  AST 28 31 49* 32  ALT 33 29 28 23   ALKPHOS 126 138* 152* 148*  BILITOT 1.1 0.9 1.2 0.8  PROT 5.5* 5.5* 5.7* 5.8*  ALBUMIN 2.1* 2.1* 2.2* 2.4*   CBG: Recent Labs  Lab 07/28/18 1701 07/28/18 2047 07/29/18 0717 07/29/18 1126 07/29/18 1638  GLUCAP 155* 172* 139* 176* 120*   Urinalysis    Component Value Date/Time   COLORURINE YELLOW 07/22/2018 1554   APPEARANCEUR CLEAR 07/22/2018 1554   LABSPEC 1.008 07/22/2018 1554   PHURINE 5.0 07/22/2018 1554   GLUCOSEU NEGATIVE 07/22/2018 1554   HGBUR SMALL (A) 07/22/2018 1554   BILIRUBINUR NEGATIVE 07/22/2018 1554   KETONESUR NEGATIVE 07/22/2018 1554   PROTEINUR NEGATIVE 07/22/2018 1554   NITRITE NEGATIVE 07/22/2018 1554   LEUKOCYTESUR NEGATIVE 07/22/2018 1554    I discussed in detail with patient's daughter, updated care and answered questions.  Time coordinating discharge: 50 minutes  SIGNED:  Marcellus Scott, MD, FACP, [facility]. [facility]  To contact the attending provider between 7A-7P or the covering provider during after hours 7P-7A, please log into the web site [email or web address] and access using universal [place] password for  that web site. If you do not have the password, please call the hospital operator.

## 2018-07-29 NOTE — Care Management Note (Addendum)
Case Management Note  Patient Details  Name: Marsheila Teale MRN: [id number] Date of Birth: 1940/08/16  Subjective/Objective:                  Discharged   Action/Plan: dme- o2, 3 in 1, rolling walker, hhc-pt and ot [facility] for dme [facility] for [facility]  Expected Discharge Date:  07/29/18               Expected Discharge Plan:  Home w Home Health Services  In-House Referral:     Discharge planning Services  CM Consult  Post Acute Care Choice:  Durable Medical Equipment, Home Health Choice offered to:  Patient  DME Arranged:  3-N-1, Oxygen, Walker rolling DME Agency:  [facility]  HH Arranged:  PT, OT [facility] Agency:  [facility]  Status of Service:  Completed, signed off  If discussed at Long Length of Stay Meetings, dates discussed:    Additional Comments:  Golda Acre, RN 07/29/2018, 2:37 PM

## 2018-08-12 ENCOUNTER — Other Ambulatory Visit: Payer: Self-pay | Admitting: Internal Medicine

## 2018-08-12 DIAGNOSIS — Z1231 Encounter for screening mammogram for malignant neoplasm of breast: Secondary | ICD-10-CM

## 2018-09-12 ENCOUNTER — Ambulatory Visit: Payer: Medicare Other

## 2019-09-18 ENCOUNTER — Ambulatory Visit: Payer: Medicare Other | Attending: Internal Medicine

## 2019-09-18 DIAGNOSIS — Z23 Encounter for immunization: Secondary | ICD-10-CM

## 2019-09-18 NOTE — Progress Notes (Signed)
   Covid-19 Vaccination Clinic  Name:  Kristen Martinez    MRN: [id number] DOB: 09-29-1940  09/18/2019  [name] was observed post Covid-19 immunization for 15 minutes without incident. She was provided with Vaccine Information Sheet and instruction to access the V-Safe system.   [name] was instructed to call 911 with any severe reactions post vaccine: Marland Kitchen Difficulty breathing  . Swelling of face and throat  . A fast heartbeat  . A bad rash all over body  . Dizziness and weakness   Immunizations Administered    Name Date Dose VIS Date Route   Pfizer COVID-19 Vaccine 09/18/2019  1:12 PM 0.3 mL 05/30/2019 Intramuscular   Manufacturer: [employer], [employer]   Lot: [id number]   NDC: [address]-1

## 2019-10-13 ENCOUNTER — Ambulatory Visit: Payer: Medicare Other | Attending: Internal Medicine

## 2019-10-13 DIAGNOSIS — Z23 Encounter for immunization: Secondary | ICD-10-CM

## 2019-10-13 NOTE — Progress Notes (Signed)
   Covid-19 Vaccination Clinic  Name:  Kristen Martinez    MRN: [id number] DOB: 1940-07-28  10/13/2019  [name] was observed post Covid-19 immunization for 15 minutes without incident. She was provided with Vaccine Information Sheet and instruction to access the V-Safe system.   [name] was instructed to call 911 with any severe reactions post vaccine: Marland Kitchen Difficulty breathing  . Swelling of face and throat  . A fast heartbeat  . A bad rash all over body  . Dizziness and weakness   Immunizations Administered    Name Date Dose VIS Date Route   Pfizer COVID-19 Vaccine 10/13/2019 10:13 AM 0.3 mL 08/13/2018 Intramuscular   Manufacturer: [employer], [employer]   Lot: [id number]   NDC: [address]-0

## 2019-11-28 ENCOUNTER — Encounter: Payer: Self-pay | Admitting: Podiatry

## 2019-11-28 ENCOUNTER — Ambulatory Visit: Payer: Medicare Other | Admitting: Podiatry

## 2019-11-28 ENCOUNTER — Other Ambulatory Visit: Payer: Self-pay

## 2019-11-28 VITALS — BP 155/84 | HR 63

## 2019-11-28 DIAGNOSIS — E119 Type 2 diabetes mellitus without complications: Secondary | ICD-10-CM

## 2019-11-28 NOTE — Progress Notes (Signed)
This patient returns to my office for at risk foot care.  This patient requires this care by a professional since this patient will be at risk due to having diabetes.   This patient is unable to cut nails herself since the patient cannot reach her nails.These nails are painful walking and wearing shoes.  This patient presents for at risk foot care today.  General Appearance  Alert, conversant and in no acute stress.  Vascular  Dorsalis pedis and posterior tibial  pulses are palpable  bilaterally.  Capillary return is within normal limits  bilaterally. Temperature is within normal limits  bilaterally.  Neurologic  Senn-Weinstein monofilament wire test within normal limits  bilaterally. Muscle power within normal limits bilaterally.  Nails Thick disfigured discolored nails with subungual debris  from hallux to fifth toes bilaterally. No evidence of bacterial infection or drainage bilaterally.  Orthopedic  No limitations of motion  feet .  No crepitus or effusions noted.  No bony pathology or digital deformities noted.Mild  HAV  B/L.  Pes planus.  Skin  normotropic skin with no porokeratosis noted bilaterally.  No signs of infections or ulcers noted.     Onychomycosis  Pain in right toes  Pain in left toes  Consent was obtained for treatment procedures.   Mechanical debridement of nails 1-5  bilaterally performed with a nail nipper.  Filed with dremel without incident.    Return office visit    3 months                  Told patient to return for periodic foot care and evaluation due to potential at risk complications.   Helane Gunther DPM

## 2020-01-15 ENCOUNTER — Encounter: Payer: Self-pay | Admitting: *Deleted

## 2020-01-19 ENCOUNTER — Ambulatory Visit: Payer: Medicare Other | Admitting: Diagnostic Neuroimaging

## 2020-01-19 ENCOUNTER — Encounter: Payer: Self-pay | Admitting: Diagnostic Neuroimaging

## 2020-01-19 VITALS — BP 144/62 | HR 61 | Ht 62.0 in | Wt 177.2 lb

## 2020-01-19 DIAGNOSIS — F0390 Unspecified dementia without behavioral disturbance: Secondary | ICD-10-CM

## 2020-01-19 DIAGNOSIS — F03A0 Unspecified dementia, mild, without behavioral disturbance, psychotic disturbance, mood disturbance, and anxiety: Secondary | ICD-10-CM

## 2020-01-19 NOTE — Patient Instructions (Signed)
MILD DEMENTIA - continue donepezil 10mg  at bedtime - reviewed safety and supervision issues - no driving; caution with finances and medications and living alone

## 2020-01-19 NOTE — Progress Notes (Signed)
[facility]  PATIENT: Kristen Martinez DOB: September 30, 1940  REFERRING CLINICIAN: Woodroe Chen, PA-C HISTORY FROM: patient  REASON FOR VISIT: new consult    HISTORICAL  CHIEF COMPLAINT:  Chief Complaint  Patient presents with  . Dementia    rm 7 New Pt MMSE 20    HISTORY OF PRESENT ILLNESS:   79 year old female here for evaluation of dementia. Patient reports diagnosis of dementia when she was living in [place] several years ago. She participated in some research studies. She has some family members with dementia as well. Patient moved to [place] in January 2020 to be closer to family. Patient lives alone and maintains most of her ADLs. She does not drive anymore. She is able to manage her bills and finances, medications and appointments. She has some short-term memory loss and forgetfulness. She has 3 daughters who live nearby and help her when she needs. Unfortunately patient had to go to [place] July 4 weekend 2021, to attend funeral for one of her daughter who unexpectedly passed away recently.   REVIEW OF SYSTEMS: Full 14 system review of systems performed and negative with exception of: As per HPI.  ALLERGIES: Allergies  Allergen Reactions  . Penicillins Nausea And Vomiting    Did it involve swelling of the face/tongue/throat, SOB, or low BP? No Did it involve sudden or severe rash/hives, skin peeling, or any reaction on the inside of your mouth or nose? No Did you need to seek medical attention at a hospital or doctor's office? Yes When did it last happen?more than 10 years ago as of present date January 2020 If all above answers are "NO", may proceed with cephalosporin use.  Pt does report she experiences a particular taste in her mouth   . Prednisone     Tongue swelling    HOME MEDICATIONS: Outpatient Medications Prior to Visit  Medication Sig Dispense Refill  . calcium carbonate (TUMS - DOSED IN MG ELEMENTAL CALCIUM) 500 MG chewable  tablet Chew 1 tablet by mouth at bedtime.    . donepezil (ARICEPT) 10 MG tablet Take 10 mg by mouth at bedtime.    . DULoxetine (CYMBALTA) 30 MG capsule Take 30 mg by mouth daily.    Marland Kitchen UNABLE TO FIND SMARTSIG:1 Drop(s) In Eye(s) Every Evening    . PROLENSA 0.07 % SOLN SMARTSIG:1 Drop(s) Right Eye Every Evening    . Travoprost, BAK Free, (TRAVATAN) 0.004 % SOLN ophthalmic solution     . ACCU-CHEK AVIVA PLUS test strip 1 each daily. (Patient not taking: Reported on 01/19/2020)    . acetaminophen (TYLENOL) 325 MG tablet Take 2 tablets (650 mg total) by mouth every 6 (six) hours as needed for mild pain, moderate pain or fever. (Patient not taking: Reported on 01/19/2020)    . albuterol (PROVENTIL HFA;VENTOLIN HFA) 108 (90 Base) MCG/ACT inhaler Inhale 2 puffs into the lungs every 6 (six) hours as needed for wheezing or shortness of breath. (Patient not taking: Reported on 01/19/2020) 1 Inhaler 0  . BESIVANCE 0.6 % SUSP Place 1 drop into the right eye 3 (three) times daily.    . traZODone (DESYREL) 50 MG tablet      No facility-administered medications prior to visit.    PAST MEDICAL HISTORY: Past Medical History:  Diagnosis Date  . Allergic rhinitis   . Arthritis   . Diabetes mellitus without complication (HCC)   . GERD (gastroesophageal reflux disease)   . Hypertension   . Memory loss   . OSA (  obstructive sleep apnea)   . Overactive bladder     PAST SURGICAL HISTORY: Past Surgical History:  Procedure Laterality Date  . APPENDECTOMY    . CESAREAN SECTION    . CHOLECYSTECTOMY N/A 07/19/2018   Procedure: LAPAROSCOPIC CHOLECYSTECTOMY;  Surgeon: Almond Lint, MD;  Location: [facility];  Service: General;  Laterality: N/A;  . EYE SURGERY Bilateral    cataracts    FAMILY HISTORY: Family History  Problem Relation Age of Onset  . Diabetes Mother   . Alzheimer's disease Mother   . Hypertension Father   . Breast cancer Other   . Diabetes Other   . Alzheimer's disease Sister   . Stroke Neg Hx    . CAD Neg Hx   . Liver disease Neg Hx     SOCIAL HISTORY: Social History   Socioeconomic History  . Marital status: Widowed    Spouse name: Not on file  . Number of children: 6  . Years of education: 63  . Highest education level: Not on file  Occupational History    Comment: retired  Tobacco Use  . Smoking status: Never Smoker  . Smokeless tobacco: Never Used  . Tobacco comment: as teen rarely  Substance and Sexual Activity  . Alcohol use: Never  . Drug use: Never  . Sexual activity: Not on file  Other Topics Concern  . Not on file  Social History Narrative   01/18/20 lives alone   Social Determinants of Health   Financial Resource Strain:   . Difficulty of Paying Living Expenses:   Food Insecurity:   . Worried About Programme researcher, broadcasting/film/video in the Last Year:   . Barista in the Last Year:   Transportation Needs:   . Freight forwarder (Medical):   Marland Kitchen Lack of Transportation (Non-Medical):   Physical Activity:   . Days of Exercise per Week:   . Minutes of Exercise per Session:   Stress:   . Feeling of Stress :   Social Connections:   . Frequency of Communication with Friends and Family:   . Frequency of Social Gatherings with Friends and Family:   . Attends Religious Services:   . Active Member of Clubs or Organizations:   . Attends Banker Meetings:   Marland Kitchen Marital Status:   Intimate Partner Violence:   . Fear of Current or Ex-Partner:   . Emotionally Abused:   Marland Kitchen Physically Abused:   . Sexually Abused:      PHYSICAL EXAM  GENERAL EXAM/CONSTITUTIONAL: Vitals:  Vitals:   01/19/20 1528  BP: (!) 144/62  Pulse: 61  Weight: 177 lb 3.2 oz (80.4 kg)  Height: 5\' 2"  (1.575 m)     Body mass index is 32.41 kg/m. Wt Readings from Last 3 Encounters:  01/19/20 177 lb 3.2 oz (80.4 kg)  07/19/18 180 lb (81.6 kg)     Patient is in no distress; well developed, nourished and groomed; neck is supple  CARDIOVASCULAR:  Examination of carotid  arteries is normal; no carotid bruits  Regular rate and rhythm, no murmurs  Examination of peripheral vascular system by observation and palpation is normal  EYES:  Ophthalmoscopic exam of optic discs and posterior segments is normal; no papilledema or hemorrhages  No exam data present  MUSCULOSKELETAL:  Gait, strength, tone, movements noted in Neurologic exam below  NEUROLOGIC: MENTAL STATUS:  MMSE - Mini Mental State Exam 01/19/2020  Orientation to time 5  Orientation to Place 4  Registration 3  Attention/ Calculation 0  Recall 2  Language- name 2 objects 2  Language- repeat 0  Language- follow 3 step command 2  Language- follow 3 step command-comments folded paper twice  Language- read & follow direction 1  Write a sentence 1  Copy design 0  Total score 20    awake, alert, oriented to person, place and time  recent and remote memory intact  DECR attention  language fluent, comprehension intact, naming intact  fund of knowledge appropriate  CRANIAL NERVE:   2nd - no papilledema on fundoscopic exam  2nd, 3rd, 4th, 6th - pupils equal and reactive to light, visual fields full to confrontation, extraocular muscles intact, no nystagmus  5th - facial sensation symmetric  7th - facial strength symmetric  8th - hearing intact  9th - palate elevates symmetrically, uvula midline  11th - shoulder shrug symmetric  12th - tongue protrusion midline  MOTOR:   normal bulk and tone, full strength in the BUE, BLE  SENSORY:   normal and symmetric to light touch, temperature, vibration  COORDINATION:   finger-nose-finger, fine finger movements normal  REFLEXES:   deep tendon reflexes TRACE and symmetric  GAIT/STATION:   narrow based gait    DIAGNOSTIC DATA (LABS, IMAGING, TESTING) - I reviewed patient records, labs, notes, testing and imaging myself where available.  Lab Results  Component Value Date   WBC 10.7 (H) 07/29/2018   HGB 7.9 (L)  07/29/2018   HCT 26.1 (L) 07/29/2018   MCV 90.3 07/29/2018   PLT 569 (H) 07/29/2018      Component Value Date/Time   NA 139 07/28/2018 0609   K 3.9 07/28/2018 0609   CL 106 07/28/2018 0609   CO2 25 07/28/2018 0609   GLUCOSE 127 (H) 07/28/2018 0609   BUN 10 07/28/2018 0609   CREATININE 0.75 07/28/2018 0609   CALCIUM 8.5 (L) 07/28/2018 0609   PROT 5.8 (L) 07/28/2018 0609   ALBUMIN 2.4 (L) 07/28/2018 0609   AST 32 07/28/2018 0609   ALT 23 07/28/2018 0609   ALKPHOS 148 (H) 07/28/2018 0609   BILITOT 0.8 07/28/2018 0609   GFRNONAA >60 07/28/2018 0609   GFRAA >60 07/28/2018 0609   Lab Results  Component Value Date   CHOL 243 (H) 07/18/2018   HDL 84 07/18/2018   LDLCALC 136 (H) 07/18/2018   TRIG 113 07/18/2018   CHOLHDL 2.9 07/18/2018   Lab Results  Component Value Date   HGBA1C 6.3 (H) 07/18/2018   No results found for: [id number] Lab Results  Component Value Date   TSH 0.564 07/18/2018       ASSESSMENT AND PLAN  79 y.o. year old female here with:  Dx:  1. Mild dementia (HCC)      PLAN:  MILD DEMENTIA (mmse 20/30) - continue donepezil 10mg  at bedtime - reviewed safety and supervision issues - no driving; caution with finances and medications and living alone  Return for pending if symptoms worsen or fail to improve, return to PCP.    , MD 01/19/2020, 3:50 PM Certified in Neurology, Neurophysiology and Neuroimaging  [facility] [address], [address] [place], [place] [id number]

## 2020-01-27 ENCOUNTER — Other Ambulatory Visit: Payer: Self-pay | Admitting: Student

## 2020-01-27 ENCOUNTER — Ambulatory Visit: Payer: Medicare Other | Admitting: Diagnostic Neuroimaging

## 2020-01-27 DIAGNOSIS — Z780 Asymptomatic menopausal state: Secondary | ICD-10-CM

## 2020-04-28 ENCOUNTER — Other Ambulatory Visit: Payer: Self-pay | Admitting: Student

## 2020-04-28 DIAGNOSIS — Z1231 Encounter for screening mammogram for malignant neoplasm of breast: Secondary | ICD-10-CM

## 2020-04-28 DIAGNOSIS — Z780 Asymptomatic menopausal state: Secondary | ICD-10-CM

## 2020-05-03 ENCOUNTER — Ambulatory Visit
Admission: RE | Admit: 2020-05-03 | Discharge: 2020-05-03 | Disposition: A | Discharge status: Home / Self Care | Payer: Medicare Other | Source: Ambulatory Visit | Attending: Student | Admitting: Student

## 2020-05-03 ENCOUNTER — Other Ambulatory Visit: Payer: Self-pay

## 2020-05-03 DIAGNOSIS — Z780 Asymptomatic menopausal state: Secondary | ICD-10-CM

## 2020-05-22 ENCOUNTER — Ambulatory Visit: Payer: Medicare Other | Attending: Internal Medicine

## 2020-05-22 DIAGNOSIS — Z23 Encounter for immunization: Secondary | ICD-10-CM

## 2020-05-22 NOTE — Progress Notes (Signed)
° °  Covid-19 Vaccination Clinic  Name:  Kristen Martinez    MRN: [id number] DOB: 10/28/1940  05/22/2020  [name] was observed post Covid-19 immunization for 15 minutes without incident. She was provided with Vaccine Information Sheet and instruction to access the V-Safe system.   [name] was instructed to call 911 with any severe reactions post vaccine:  Difficulty breathing   Swelling of face and throat   A fast heartbeat   A bad rash all over body   Dizziness and weakness   Immunizations Administered    Name Date Dose VIS Date Route   Pfizer COVID-19 Vaccine 05/22/2020 10:48 AM 0.3 mL 04/07/2020 Intramuscular   Manufacturer: [employer], [employer]   Lot: [id number]   NDC: [address]-6

## 2020-06-10 ENCOUNTER — Ambulatory Visit: Payer: Medicare Other

## 2020-06-28 ENCOUNTER — Ambulatory Visit: Payer: Medicare Other | Admitting: Obstetrics and Gynecology

## 2020-06-29 NOTE — Progress Notes (Signed)
[facility] New Patient Evaluation and Consultation  Referring Provider: No ref. provider found PCP: Vladimir Crofts, FNP (Inactive) Date of Service: 07/02/2020  SUBJECTIVE Chief Complaint: New Patient (Initial Visit)- urinary leakage  History of Present Illness: Kristen Martinez is a 80 y.o. Black or African-American female presenting for evaluation of urinary incontinence.    Urinary Symptoms: Leaks urine with with a full bladder- cant hold at all when bladder gets full.  Leaks several time(s) per day- large volumes.  Pad use: 1 adult diapers per day.   She is bothered by her UI symptoms.  Day time voids- every 2 hours.  Nocturia: wakes "all night", has a commode at bedside Voiding dysfunction: she empties her bladder well.  does not use a catheter to empty bladder.  When urinating, she feels she has no difficulties Drinks: 1/2 cup coffee, 1 L water per day Tries not to drink before bed after 5-6 pm, just a sip of water with meds. Used to use CPAP but lost weight and was told she no longer needs it. Per daughter she needs a new machine.  States her diabetes is well controlled  UTIs: 0 UTI's in the last year.   Denies history of blood in urine and kidney or bladder stones  Pelvic Organ Prolapse Symptoms:                   She Denies a feeling of a bulge the vaginal area.   Bowel Symptom: Bowel movements: 2 time(s) per day Stool consistency: soft  Straining: no.  Splinting: yes.  Incomplete evacuation: no.  She Admits to accidental bowel leakage / fecal incontinence  Occurs: 1 time(s) per week  Consistency with leakage: soft - usually happens when she holds it too long Bowel regimen: stool softener, prune juice, diet Last colonoscopy: Date 2017, Results negative  Sexual Function Sexually active: no.    Pelvic Pain Admits to pelvic pain with holding urine or bowel movements   Past Medical History:  Past Medical History:  Diagnosis Date   Allergic  rhinitis    Arthritis    Diabetes mellitus without complication (HCC)    GERD (gastroesophageal reflux disease)    Hypertension    Memory loss    OSA (obstructive sleep apnea)    Overactive bladder      Past Surgical History:   Past Surgical History:  Procedure Laterality Date   APPENDECTOMY     CESAREAN SECTION     CHOLECYSTECTOMY N/A 07/19/2018   Procedure: LAPAROSCOPIC CHOLECYSTECTOMY;  Surgeon: Almond Lint, MD;  Location: [facility];  Service: General;  Laterality: N/A;   EYE SURGERY Bilateral    cataracts     Past OB/GYN History: G6 P6 Vaginal deliveries: 4,  Forceps/ Vacuum deliveries: 1, Cesarean section: 1 Menopausal: Yes, denies vaginal bleeding Last pap smear was several years ago.  Any history of abnormal pap smears: no.   Medications: She has a current medication list which includes the following prescription(s): accu-chek aviva plus, calcium carbonate, donepezil, duloxetine, mirabegron er, trazodone, UNABLE TO FIND, acetaminophen, and albuterol.   Allergies: Patient is allergic to penicillins and prednisone.   Social History:  Social History   Tobacco Use   Smoking status: Never Smoker   Smokeless tobacco: Never Used   Tobacco comment: as teen rarely  Substance Use Topics   Alcohol use: Never   Drug use: Never    Relationship status: widowed She lives with daughter.   She is not employed. Regular exercise: Yes:  stationary bike  Family History:   Family History  Problem Relation Age of Onset   Diabetes Mother    Alzheimer's disease Mother    Hypertension Father    Breast cancer Other    Diabetes Other    Alzheimer's disease Sister    Stroke Neg Hx    CAD Neg Hx    Liver disease Neg Hx      Review of Systems: Review of Systems  Constitutional: Negative for fever, malaise/fatigue and weight loss.  Respiratory: Negative for cough, shortness of breath and wheezing.   Cardiovascular: Positive for palpitations. Negative  for chest pain and leg swelling.  Gastrointestinal: Negative for abdominal pain and blood in stool.  Genitourinary: Positive for dysuria.  Musculoskeletal: Positive for myalgias.  Skin: Negative for rash.  Neurological: Negative for dizziness and headaches.  Endo/Heme/Allergies: Does not bruise/bleed easily.  Psychiatric/Behavioral: Positive for depression. The patient is nervous/anxious.      OBJECTIVE Physical Exam: Vitals:   07/02/20 1312  BP: (!) 142/65  Pulse: 79    Physical Exam Constitutional:      General: She is not in acute distress. Pulmonary:     Effort: Pulmonary effort is normal.  Abdominal:     General: There is no distension.     Palpations: Abdomen is soft.     Tenderness: There is no abdominal tenderness. There is no rebound.  Musculoskeletal:        General: No swelling. Normal range of motion.  Skin:    General: Skin is warm and dry.     Findings: No rash.  Neurological:     Mental Status: She is alert and oriented to person, place, and time.  Psychiatric:        Mood and Affect: Mood normal.        Behavior: Behavior normal.      GU / Detailed Urogynecologic Evaluation:  Pelvic Exam: Normal external female genitalia; Bartholin's and Skene's glands normal in appearance; urethral meatus normal in appearance, no urethral masses or discharge.   CST: negative  Speculum exam reveals normal vaginal mucosa with atrophy. Cervix normal appearance. Uterus difficult to palpate due to body habitus  Pelvic floor strength III/V  Pelvic floor musculature: Right levator tender, Right obturator non-tender, Left levator tender, Left obturator non-tender  POP-Q:   POP-Q  -3                                            Aa   -3                                           Ba  -7                                              C   3                                            Gh  4  Pb  8                                             tvl   -1                                            Ap  -1                                            Bp  -7                                              D     Rectal Exam:  Normal external rectum  Post-Void Residual (PVR) by Bladder Scan: In order to evaluate bladder emptying, we discussed obtaining a postvoid residual and she agreed to this procedure.  Procedure: The ultrasound unit was placed on the patient's abdomen in the suprapubic region after the patient had voided. A PVR of 4 ml was obtained by bladder scan.  Laboratory Results: POC urine: small leukocytes, otherwise negative  I visualized the urine specimen, noting the specimen to be dark yellow  ASSESSMENT AND PLAN Ms. Deisher is a 81 y.o. with:  1. Overactive bladder   2. Urinary frequency     1. OAB We discussed the symptoms of overactive bladder (OAB), which include urinary urgency, urinary frequency, nocturia, with or without urge incontinence.  While we do not know the exact etiology of OAB, several treatment options exist. We discussed management including behavioral therapy (decreasing bladder irritants, urge suppression strategies, timed voids, bladder retraining), physical therapy, medication. Therapies for refractory cases are also available.  For Beta-3 agonist medication, we discussed the potential side effect of elevated blood pressure which is more likely to occur in individuals with uncontrolled hypertension. - Prescribed Myrbetriq 25mg  daily.  - Also discussed performing kegel exercises and distraction techniques to help decrease urge and retrain bladder.  - Discussed with daughter about getting new CPAP machine, which can help decrease nighttime urination.  2. Urinary frequency - POC urine showed 2+ leukocytes but does not have any further symptoms of uti today such as dysuria.   Follow up 6 weeks to review progress with medication.   , MD   Time spent: I spent 45  minutes dedicated to the care of this patient on the date of this encounter to include pre-visit review of records, face-to-face time with the patient and post visit documentation and ordering medication/ testing.

## 2020-06-30 ENCOUNTER — Ambulatory Visit
Admission: RE | Admit: 2020-06-30 | Discharge: 2020-06-30 | Disposition: A | Discharge status: Home / Self Care | Payer: Medicare Other | Source: Ambulatory Visit | Attending: Student | Admitting: Student

## 2020-06-30 ENCOUNTER — Other Ambulatory Visit: Payer: Self-pay | Admitting: Student

## 2020-06-30 DIAGNOSIS — R1084 Generalized abdominal pain: Secondary | ICD-10-CM

## 2020-07-02 ENCOUNTER — Other Ambulatory Visit: Payer: Self-pay

## 2020-07-02 ENCOUNTER — Ambulatory Visit (INDEPENDENT_AMBULATORY_CARE_PROVIDER_SITE_OTHER): Payer: Medicare Other | Admitting: Obstetrics and Gynecology

## 2020-07-02 ENCOUNTER — Encounter: Payer: Self-pay | Admitting: Obstetrics and Gynecology

## 2020-07-02 VITALS — BP 142/65 | HR 79

## 2020-07-02 DIAGNOSIS — R350 Frequency of micturition: Secondary | ICD-10-CM | POA: Diagnosis not present

## 2020-07-02 DIAGNOSIS — N3281 Overactive bladder: Secondary | ICD-10-CM

## 2020-07-02 LAB — POCT URINALYSIS DIPSTICK
Appearance: NORMAL
Bilirubin, UA: NEGATIVE
Blood, UA: NEGATIVE
Glucose, UA: NEGATIVE
Ketones, UA: NEGATIVE
Nitrite, UA: NEGATIVE
Protein, UA: NEGATIVE
Urobilinogen, UA: 0.2 E.U./dL
pH, UA: 6 (ref 5.0–8.0)

## 2020-07-02 MED ORDER — MIRABEGRON ER 25 MG PO TB24: 25.0000 mg | ORAL_TABLET | Freq: Every day | ORAL | 2 refills | Status: DC

## 2020-07-02 NOTE — Patient Instructions (Signed)
We discussed the symptoms of overactive bladder (OAB), which include urinary urgency, urinary frequency, night-time urination, with or without urge incontinence.  We discussed management including behavioral therapy (decreasing bladder irritants by following a bladder diet, urge suppression strategies, timed voids, bladder retraining), physical therapy, medication   For Beta-3 agonist medication, we discussed the potential side effect of elevated blood pressure which is more likely to occur in individuals with uncontrolled hypertension. You were given prescription for Myrbetriq 25 mg.  It can take a month to start working so give it time, but if you have bothersome side effects call sooner and we can try a different medication.  Call us if you have trouble filling the prescription or if it's not covered by your insurance.

## 2020-07-06 ENCOUNTER — Telehealth: Payer: Self-pay | Admitting: *Deleted

## 2020-07-06 NOTE — Telephone Encounter (Signed)
Pt called requesting that the results of her office visit be sent to her primary care physician. Verified the name and office where she would like it sent. She states that the office is [facility] and that her Drs name is [facility]. Advised pt that her records would be sent.  Pt verbalized understanding.

## 2020-07-19 ENCOUNTER — Ambulatory Visit: Payer: Medicare Other

## 2020-08-06 NOTE — Progress Notes (Deleted)
[facility] Return Visit  SUBJECTIVE  History of Present Illness: Kristen Martinez is a 80 y.o. female seen in follow-up for overactive bladder. Plan at last visit was was to start Myrbetriq 25mg  daily. We had also discussed getting a new CPAP machine for her sleep apnea.      Past Medical History: Patient  has a past medical history of Allergic rhinitis, Arthritis, Diabetes mellitus without complication (HCC), GERD (gastroesophageal reflux disease), Hypertension, Memory loss, OSA (obstructive sleep apnea), and Overactive bladder.   Past Surgical History: She  has a past surgical history that includes Cholecystectomy (N/A, 07/19/2018); Eye surgery (Bilateral); Cesarean section; and Appendectomy.   Medications: She has a current medication list which includes the following prescription(s): accu-chek aviva plus, acetaminophen, albuterol, calcium carbonate, donepezil, duloxetine, mirabegron er, trazodone, and UNABLE TO FIND.   Allergies: Patient is allergic to penicillins and prednisone.   Social History: Patient  reports that she has never smoked. She has never used smokeless tobacco. She reports that she does not drink alcohol and does not use drugs.      OBJECTIVE     Physical Exam: There were no vitals filed for this visit. Gen: No apparent distress, A&O x 3.  Detailed Urogynecologic Evaluation:  Deferred. Prior exam showed:  No flowsheet data found.     ASSESSMENT AND PLAN    Kristen Martinez is a 80 y.o. with:  No diagnosis found.

## 2020-08-17 ENCOUNTER — Ambulatory Visit: Payer: Medicare Other | Admitting: Obstetrics and Gynecology

## 2020-08-26 NOTE — Progress Notes (Signed)
[facility] Return Visit  SUBJECTIVE  History of Present Illness: Kristen Martinez is a 80 y.o. female seen in follow-up for OAB. Plan at last visit was to start Myrbetriq 25mg  daily. We also discussed getting a new CPAP for her sleep apnea- she said she had a home test recently and was told she did not need one.  Has not been taking the medication because she thinks it was too expensive (she believes >$100). She feels her symptoms have improved overall. She is able to get to the bathroom without urgency. Voids less than 6 times per day. Not leaking urine anymore. At nighttime, wakes two times per night. Has been drinking more water during the day.   Past Medical History: Patient  has a past medical history of Allergic rhinitis, Arthritis, Diabetes mellitus without complication (HCC), GERD (gastroesophageal reflux disease), Hypertension, Memory loss, OSA (obstructive sleep apnea), and Overactive bladder.   Past Surgical History: She  has a past surgical history that includes Cholecystectomy (N/A, 07/19/2018); Eye surgery (Bilateral); Cesarean section; and Appendectomy.   Medications: She has a current medication list which includes the following prescription(s): accu-chek aviva plus, acetaminophen, albuterol, calcium carbonate, donepezil, duloxetine, esomeprazole, trazodone, UNABLE TO FIND, and mirabegron er.   Allergies: Patient is allergic to penicillins and prednisone.   Social History: Patient  reports that she has never smoked. She has never used smokeless tobacco. She reports that she does not drink alcohol and does not use drugs.      OBJECTIVE     Physical Exam: Vitals:   08/31/20 0950  BP: 132/71  Pulse: (!) 52  Weight: 177 lb (80.3 kg)  Height: 5\' 2"  (1.575 m)   Gen: No apparent distress, A&O x 3.  Detailed Urogynecologic Evaluation:  Deferred. Prior exam showed (07/02/20):   POP-Q  -3                                            Aa   -3                                            Ba  -7                                              C   3                                            Gh  4                                            Pb  8                                            tvl   -1  Ap  -1                                            Bp  -7                                              D       ASSESSMENT AND PLAN    Kristen Martinez is a 80 y.o. with:  1. Overactive bladder    - Symptoms have improved without medication.  - We discussed that if symptoms return then we can restart a medication (Trospium if Myrbetriq is too expensive). She will not need an appt to start the medication but will need a follow up to ensure effectiveness.  - She will call our office if symptoms change.   Kristen Beards, MD  Time spent: I spent 20 minutes dedicated to the care of this patient on the date of this encounter to include pre-visit review of records, face-to-face time with the patient and post visit documentation.

## 2020-08-30 ENCOUNTER — Inpatient Hospital Stay: Admission: RE | Admit: 2020-08-30 | Payer: Medicare Other | Source: Ambulatory Visit

## 2020-08-31 ENCOUNTER — Other Ambulatory Visit: Payer: Self-pay

## 2020-08-31 ENCOUNTER — Encounter: Payer: Self-pay | Admitting: Obstetrics and Gynecology

## 2020-08-31 ENCOUNTER — Ambulatory Visit (INDEPENDENT_AMBULATORY_CARE_PROVIDER_SITE_OTHER): Payer: Medicare Other | Admitting: Obstetrics and Gynecology

## 2020-08-31 VITALS — BP 132/71 | HR 52 | Ht 62.0 in | Wt 177.0 lb

## 2020-08-31 DIAGNOSIS — N3281 Overactive bladder: Secondary | ICD-10-CM | POA: Diagnosis not present

## 2020-10-25 ENCOUNTER — Ambulatory Visit: Payer: Medicare Other

## 2020-12-17 ENCOUNTER — Ambulatory Visit
Admission: RE | Admit: 2020-12-17 | Discharge: 2020-12-17 | Disposition: A | Discharge status: Home / Self Care | Payer: Medicare Other | Source: Ambulatory Visit | Attending: Student | Admitting: Student

## 2020-12-17 ENCOUNTER — Other Ambulatory Visit: Payer: Self-pay

## 2020-12-17 DIAGNOSIS — Z1231 Encounter for screening mammogram for malignant neoplasm of breast: Secondary | ICD-10-CM

## 2021-04-07 ENCOUNTER — Other Ambulatory Visit: Payer: Self-pay | Admitting: Student

## 2021-04-07 ENCOUNTER — Ambulatory Visit
Admission: RE | Admit: 2021-04-07 | Discharge: 2021-04-07 | Disposition: A | Discharge status: Home / Self Care | Payer: Medicare Other | Source: Ambulatory Visit | Attending: Student | Admitting: Student

## 2021-04-07 DIAGNOSIS — R52 Pain, unspecified: Secondary | ICD-10-CM

## 2021-04-19 IMAGING — CR DG ABDOMEN 2V
2 series · 2 of 2 positions shown · non-contrast
Comparison: None.

CLINICAL DATA: Generalized abdominal pain.

EXAM:
ABDOMEN - 2 VIEW

[t abdomen supine]
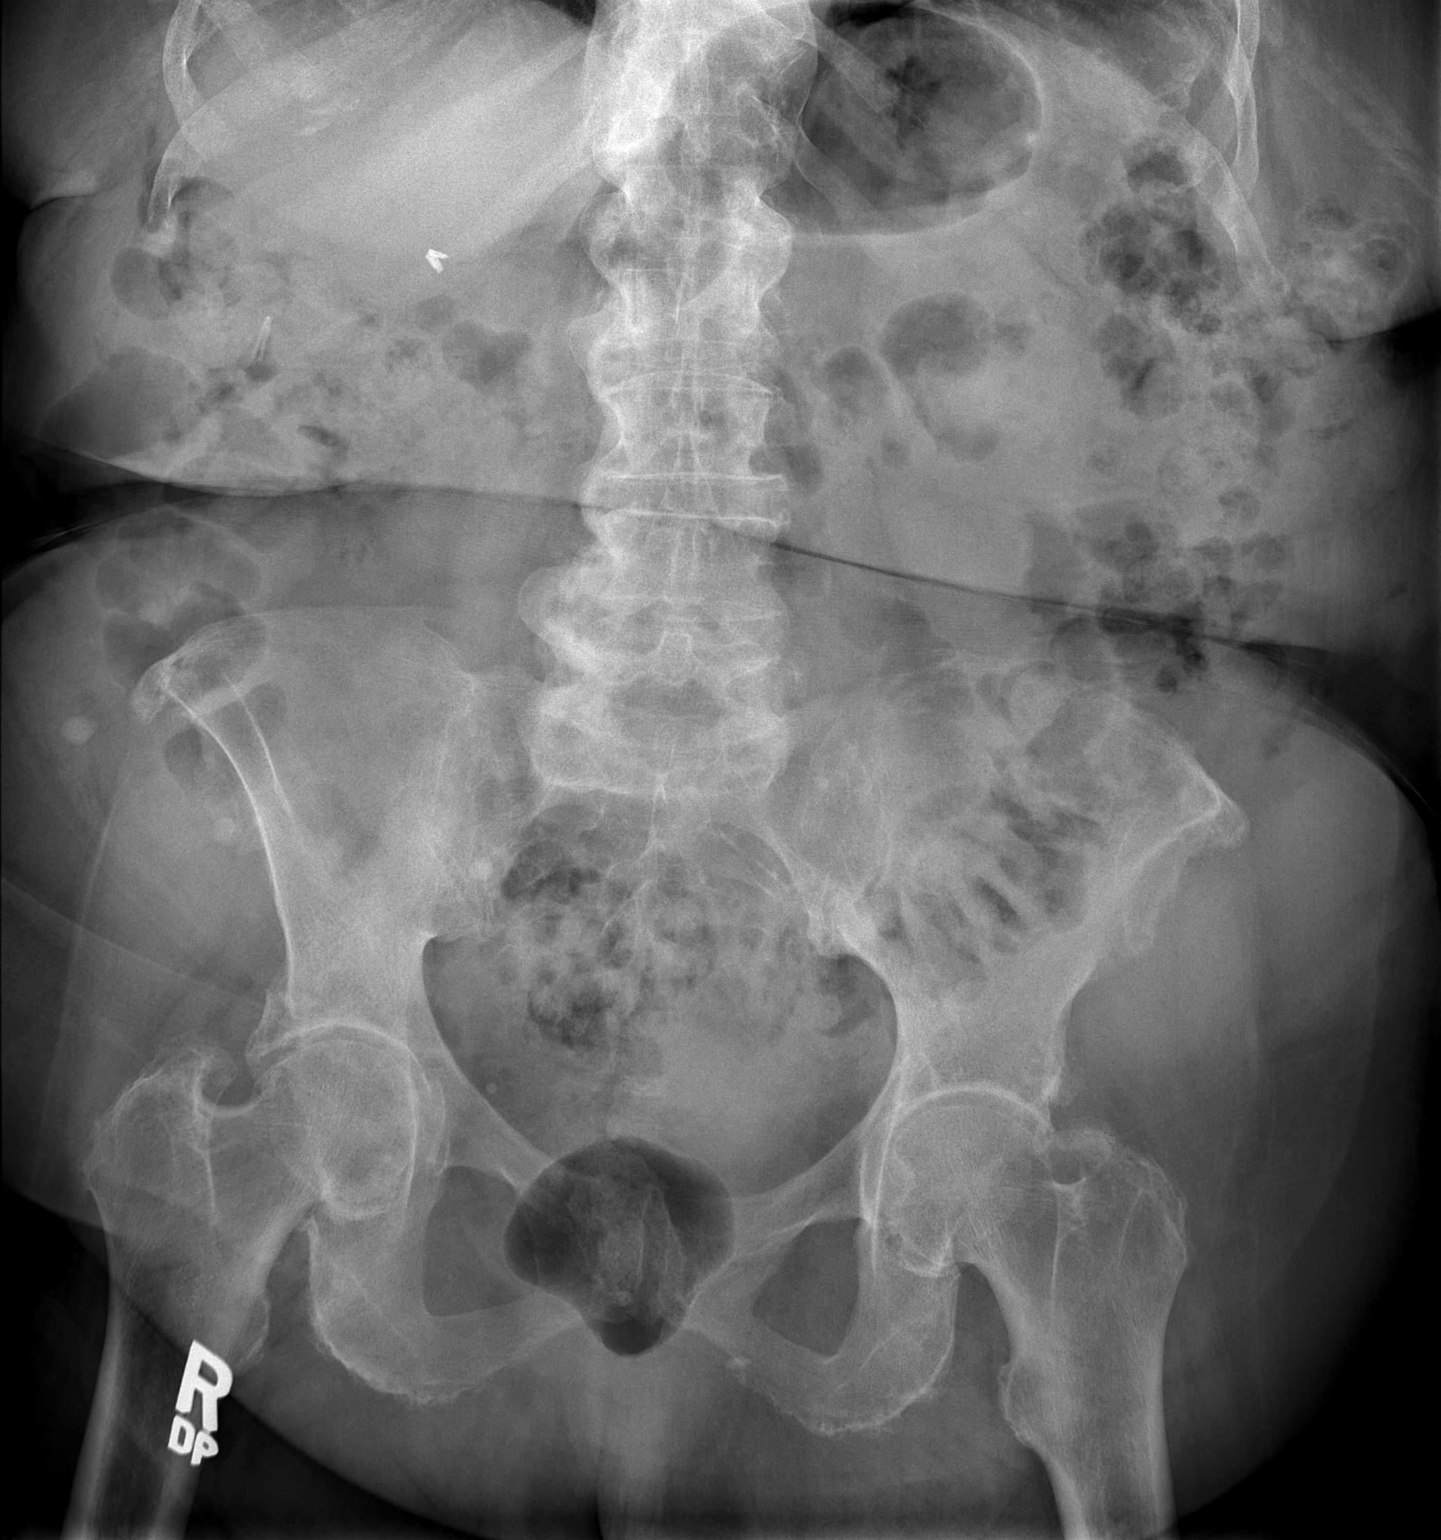

[w abdomen upright *]
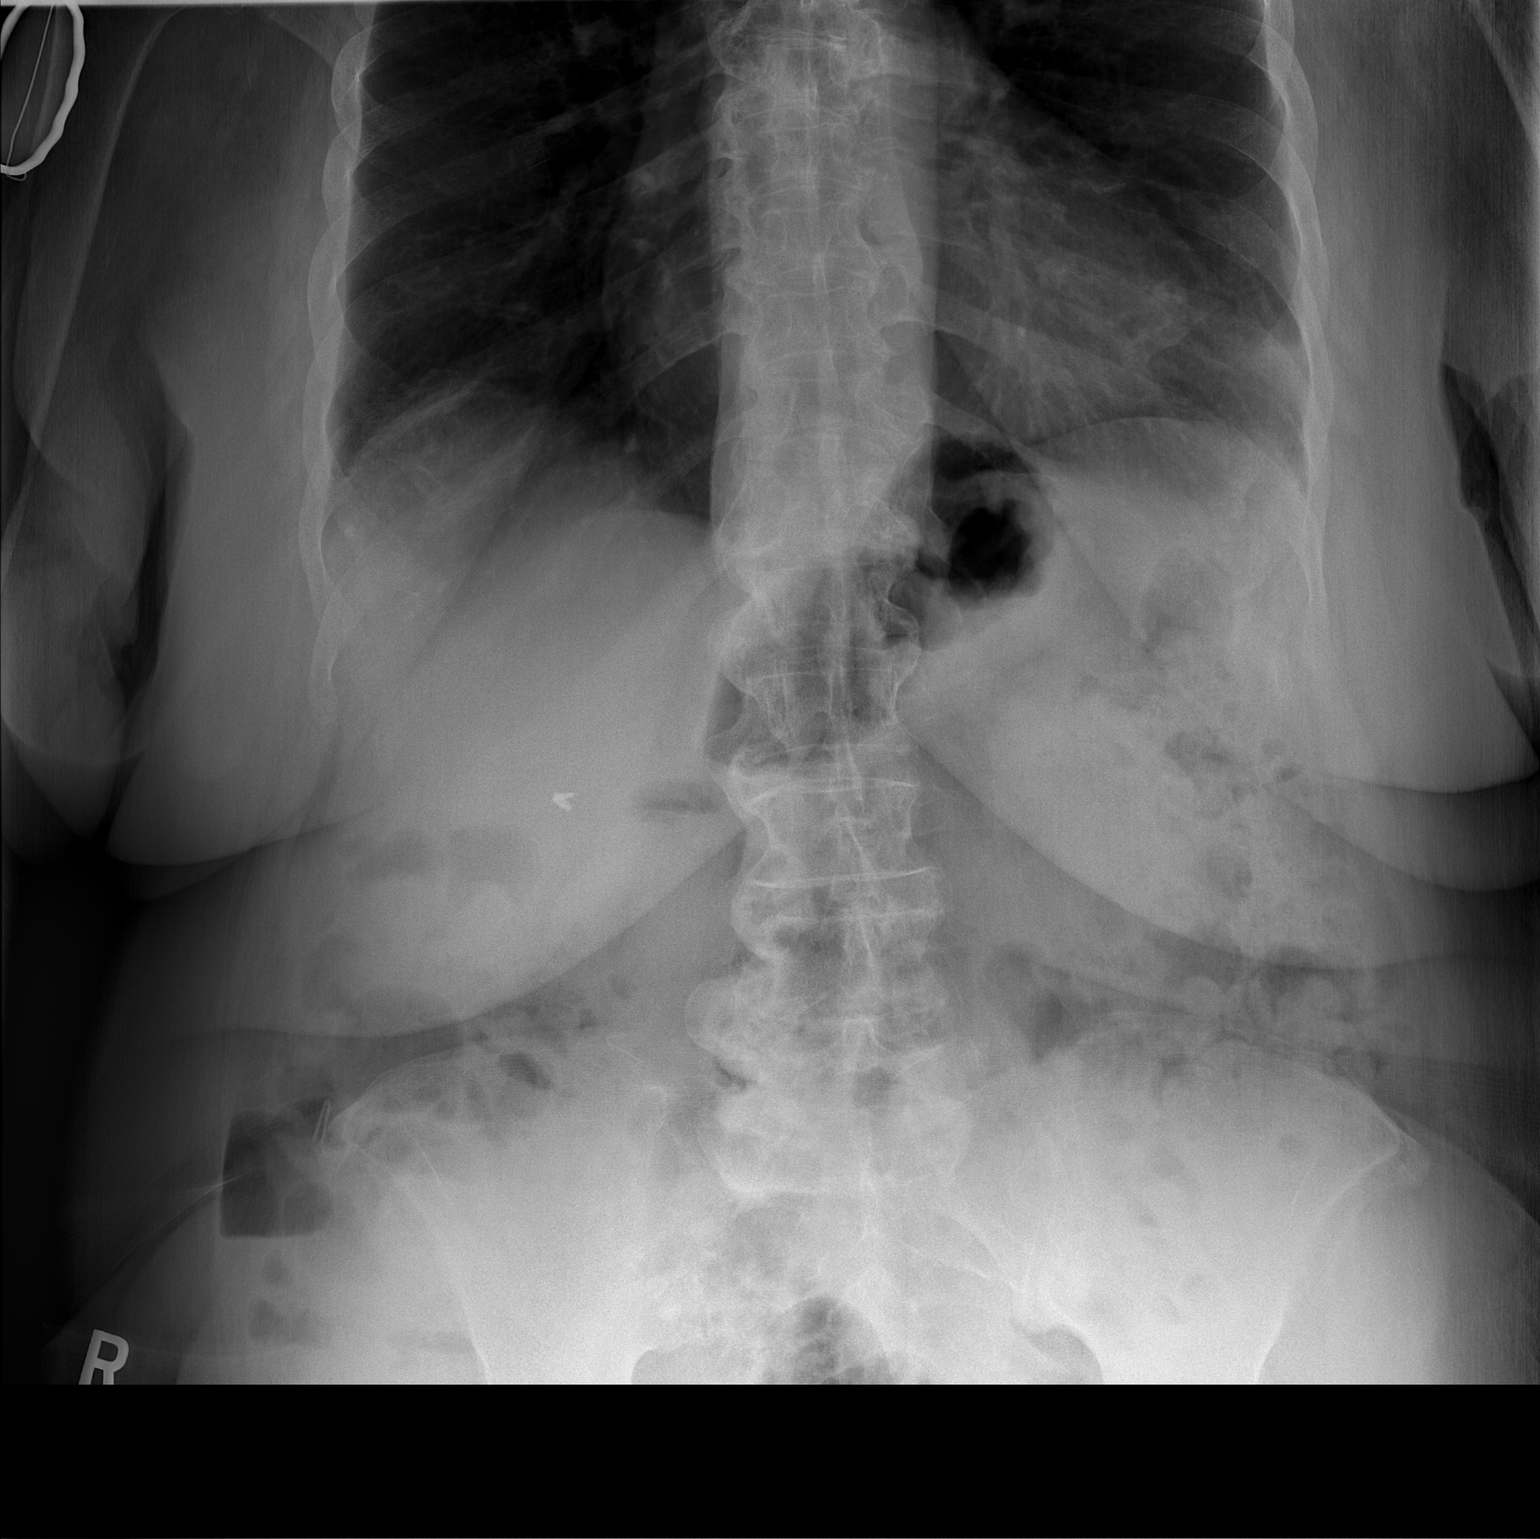

[2 of 2 positions shown; findings below may reference images not displayed]

FINDINGS: The bowel gas pattern is nonobstructive. There is a moderate amount
of stool throughout the colon. There are no definite radiopaque
kidney stones. There are advanced degenerative changes of the right
hip. There are moderate degenerative changes of the left hip. There
are moderate multilevel degenerative changes throughout the
visualized thoracolumbar spine. There is age-indeterminate height
loss of the T11 vertebral body, likely new since 2525.
IMPRESSION: 1. Nonobstructive bowel gas pattern.
2. Moderate amount of stool throughout the colon.
3. Age-indeterminate height loss of the T11 vertebral body, likely
new since 2525. Correlate with physical exam.

## 2022-01-25 IMAGING — CR DG CHEST 2V
2 series · 2 of 2 positions shown · non-contrast
Comparison: 07/25/2018

CLINICAL DATA: Shortness of breath and chest pain

EXAM:
CHEST - 2 VIEW

[w chest pa *]
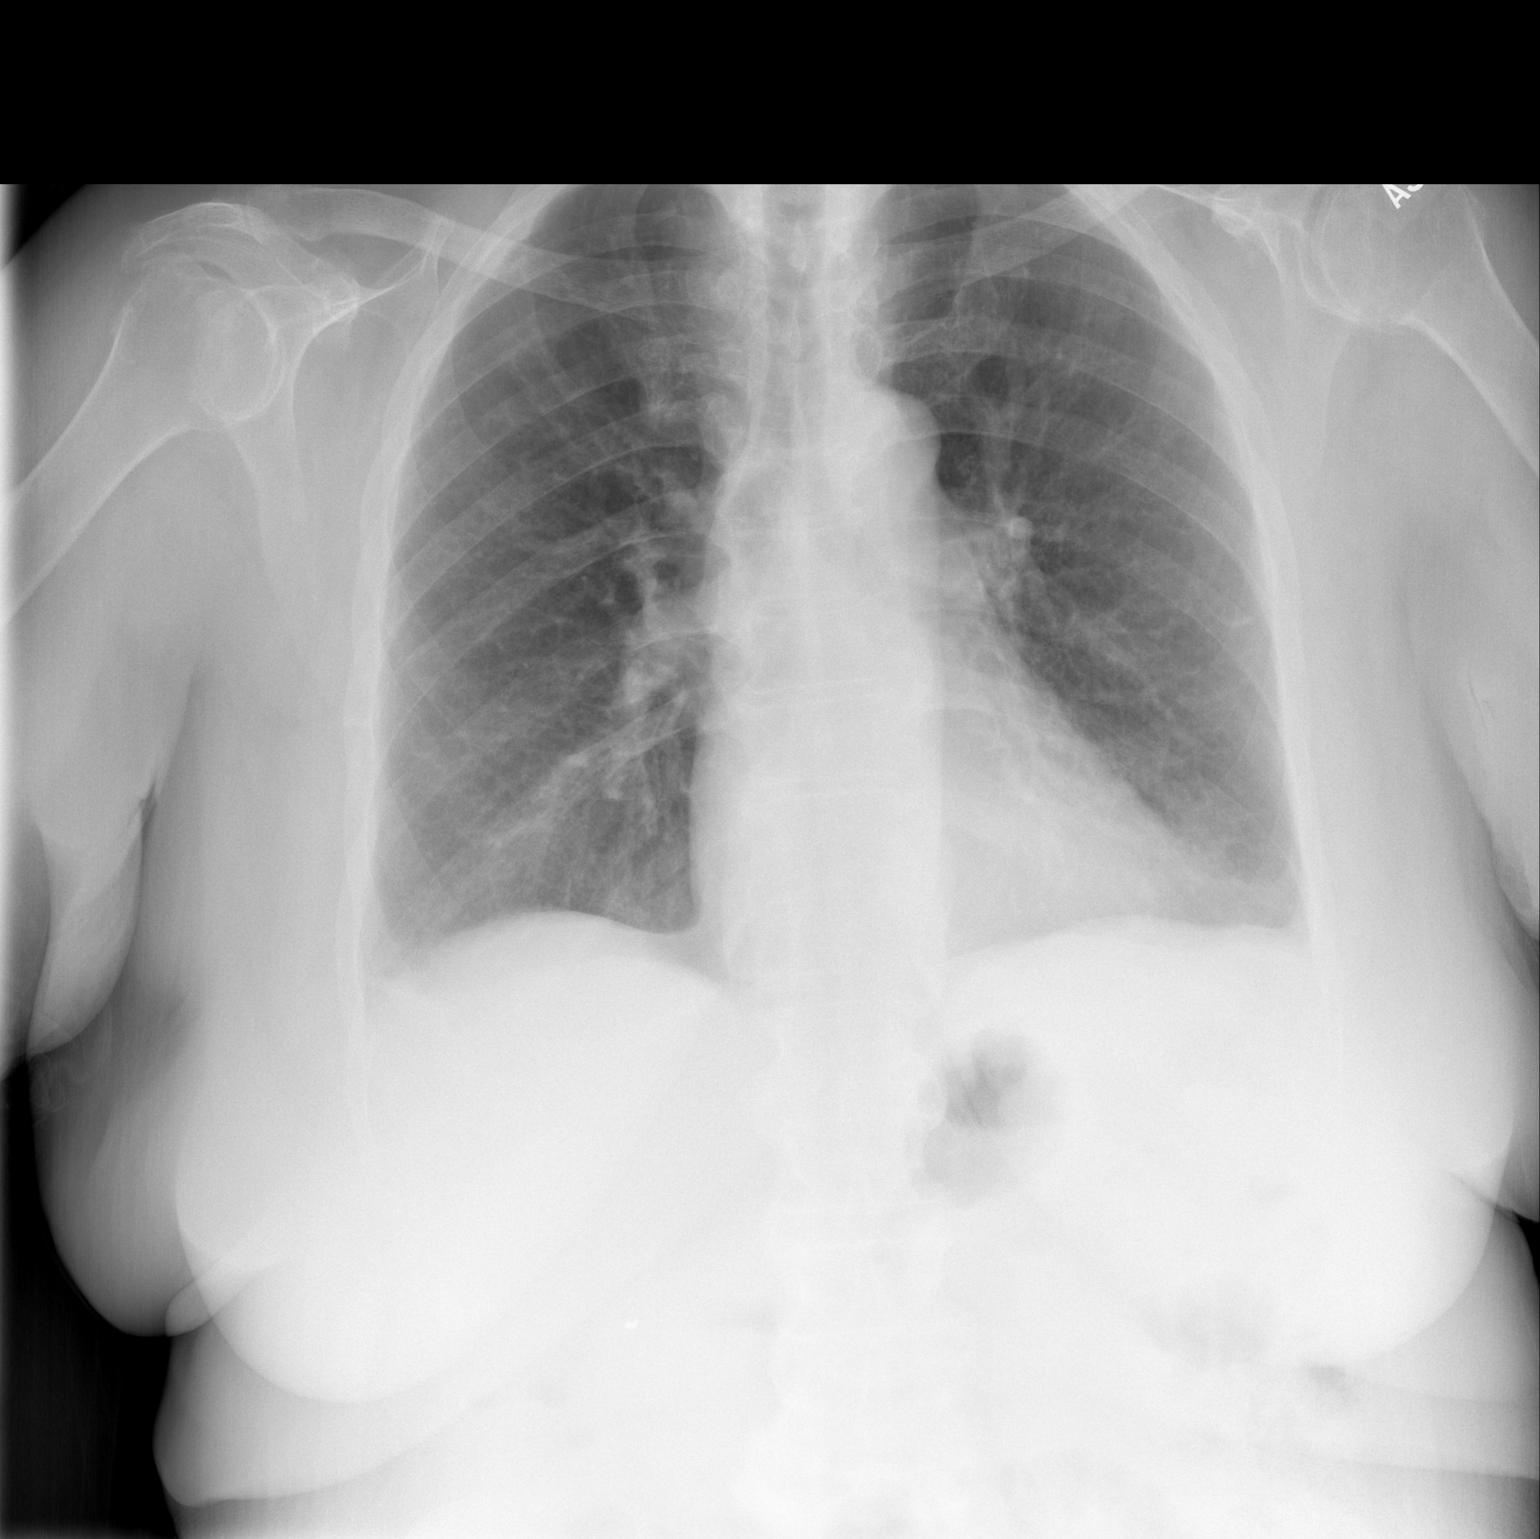

[w chest lat *]
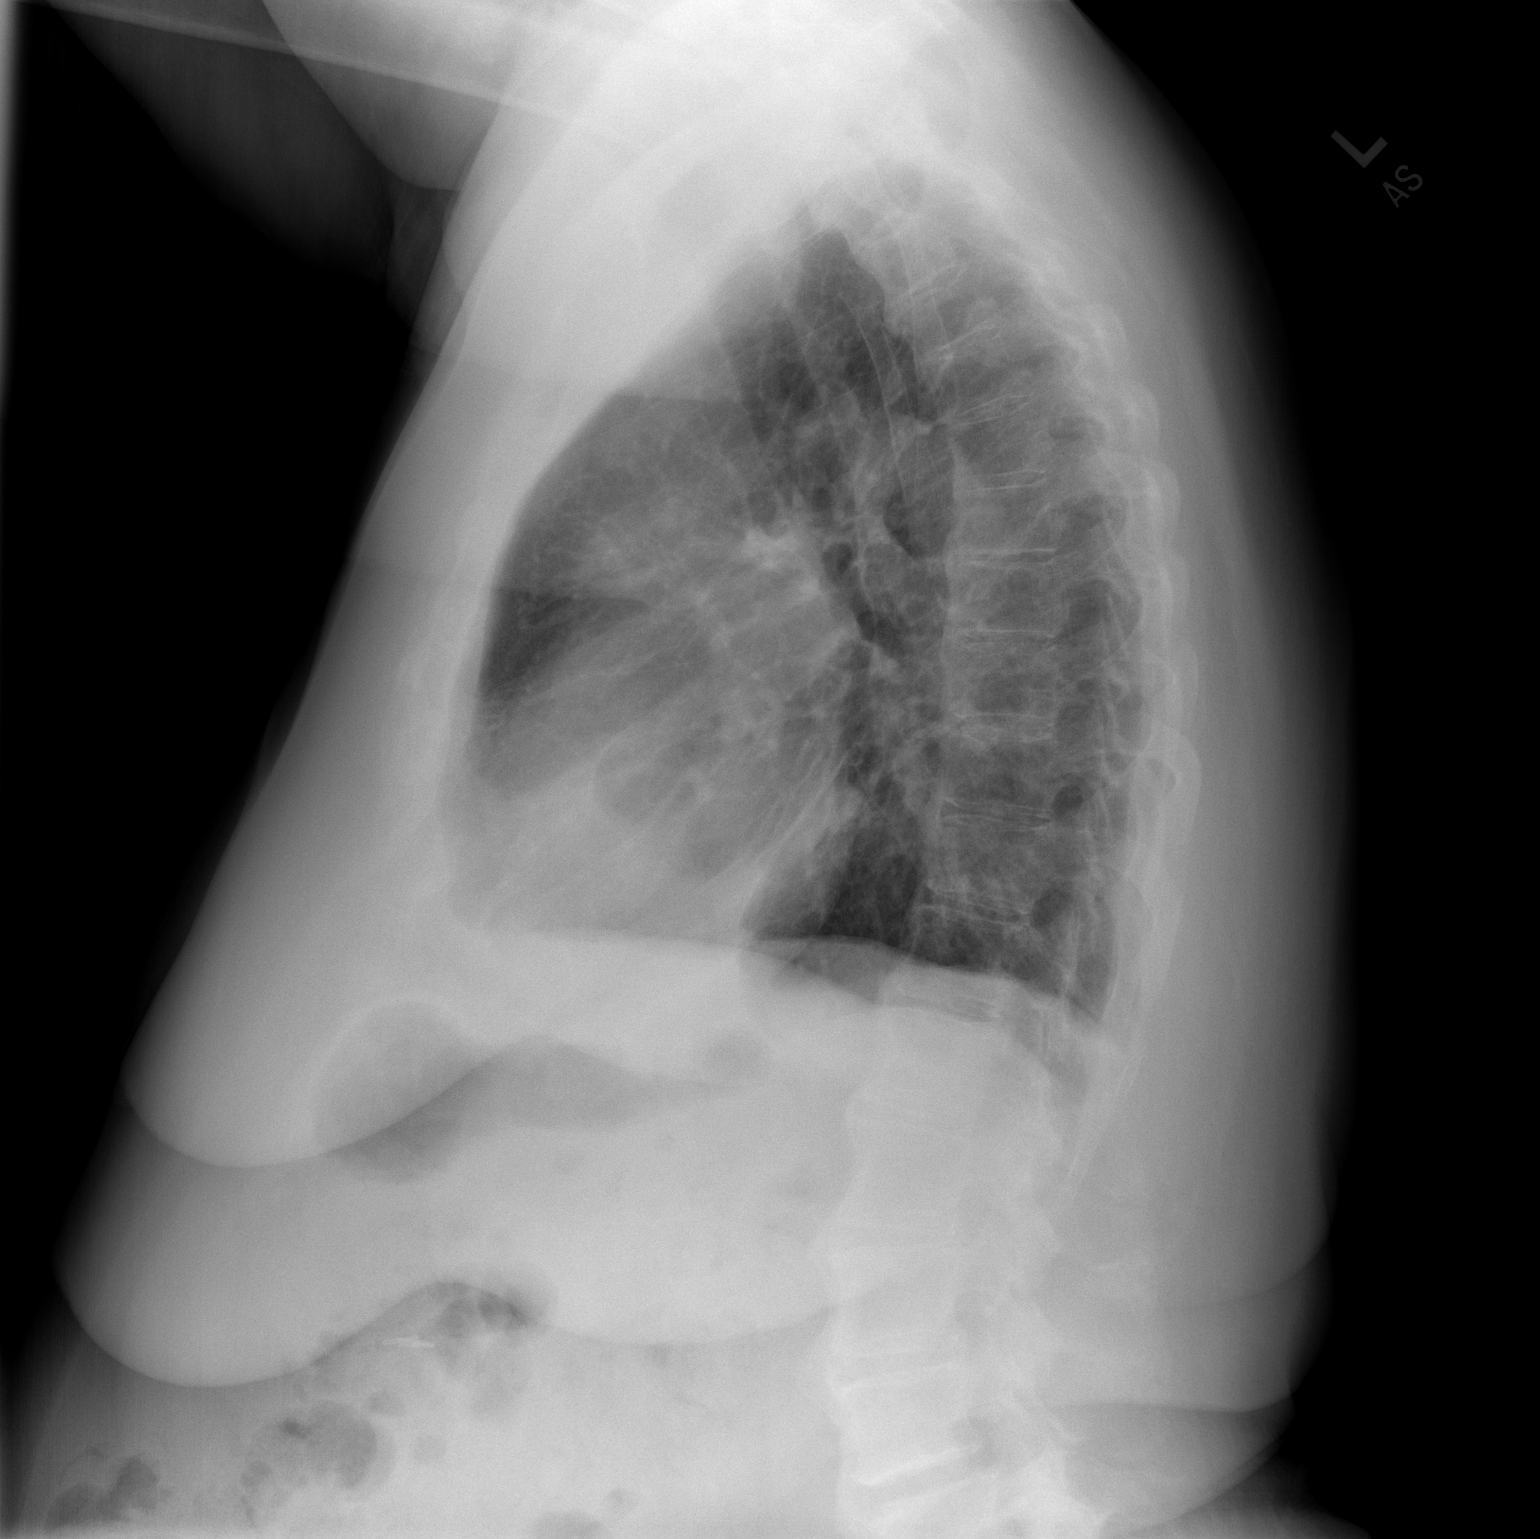

[2 of 2 positions shown; findings below may reference images not displayed]

FINDINGS: Cardiac shadow is within normal limits. The lungs are well aerated
bilaterally. Minimal platelike atelectasis is noted in the left
base. No focal infiltrate or effusion is seen. No acute bony
abnormality is noted.
IMPRESSION: Minimal atelectasis in the left base. No other focal abnormality is
noted.

## 2022-02-08 ENCOUNTER — Other Ambulatory Visit: Payer: Self-pay

## 2022-02-13 ENCOUNTER — Ambulatory Visit: Payer: Medicare Other | Attending: Cardiology

## 2022-02-13 ENCOUNTER — Telehealth: Payer: Self-pay

## 2022-02-13 DIAGNOSIS — R002 Palpitations: Secondary | ICD-10-CM

## 2022-02-13 NOTE — Progress Notes (Unsigned)
Enrolled for [employer] to mail a ZIO XT long term holter monitor to the patients address on file.  

## 2022-02-13 NOTE — Telephone Encounter (Signed)
Order for monitor placed.     Tobb, Kardie, DO  Renada Cronin, Cleotilde Neer, RN Yes it is for palpitations  Thanks Eileen Stanford        Previous Messages    ----- Message -----  From: Baird Cancer, RN  Sent: 02/09/2022   4:23 PM EDT  To: Thomasene Ripple, DO  Subject: monitor                                         Per Secure chat- Another favor - please send a monitor for Kristen Martinez [birth year]-   Hi Dr. Servando Salina- you wanted a monitor for this patient- would you like 7 day zio? And is this for palps? Just let me know and I can order!   Thank you  Eileen Stanford b. RN

## 2022-03-06 ENCOUNTER — Ambulatory Visit: Payer: Medicare Other | Attending: Cardiology | Admitting: Cardiology

## 2022-03-06 ENCOUNTER — Encounter: Payer: Self-pay | Admitting: Cardiology

## 2022-03-06 VITALS — BP 152/72 | HR 50 | Ht 62.0 in | Wt 174.4 lb

## 2022-03-06 DIAGNOSIS — R400 Somnolence: Secondary | ICD-10-CM | POA: Diagnosis not present

## 2022-03-06 DIAGNOSIS — R011 Cardiac murmur, unspecified: Secondary | ICD-10-CM

## 2022-03-06 DIAGNOSIS — R7303 Prediabetes: Secondary | ICD-10-CM

## 2022-03-06 DIAGNOSIS — G4733 Obstructive sleep apnea (adult) (pediatric): Secondary | ICD-10-CM

## 2022-03-06 DIAGNOSIS — R079 Chest pain, unspecified: Secondary | ICD-10-CM

## 2022-03-06 DIAGNOSIS — I10 Essential (primary) hypertension: Secondary | ICD-10-CM

## 2022-03-06 DIAGNOSIS — R5383 Other fatigue: Secondary | ICD-10-CM

## 2022-03-06 DIAGNOSIS — E782 Mixed hyperlipidemia: Secondary | ICD-10-CM

## 2022-03-06 LAB — HEMOGLOBIN A1C
Est. average glucose Bld gHb Est-mCnc: 131 mg/dL
Hgb A1c MFr Bld: 6.2 % — ABNORMAL HIGH (ref 4.8–5.6)

## 2022-03-06 NOTE — Patient Instructions (Addendum)
Medication Instructions:  Your physician recommends that you continue on your current medications as directed. Please refer to the Current Medication list given to you today.  *If you need a refill on your cardiac medications before your next appointment, please call your pharmacy*   Lab Work: TODAY: [id number] If you have labs (blood work) drawn today and your tests are completely normal, you will receive your results only by: [facility] (if you have MyChart) OR A paper copy in the mail If you have any lab test that is abnormal or we need to change your treatment, we will call you to review the results.   Testing/Procedures: Your physician has requested that you have an echocardiogram. Echocardiography is a painless test that uses sound waves to create images of your heart. It provides your doctor with information about the size and shape of your heart and how well your heart's chambers and valves are working. This procedure takes approximately one hour. There are no restrictions for this procedure.  Your physician has recommended that you have a sleep study. This test records several body functions during sleep, including: brain activity, eye movement, oxygen and carbon dioxide blood levels, heart rate and rhythm, breathing rate and rhythm, the flow of air through your mouth and nose, snoring, body muscle movements, and chest and belly movement.    Follow-Up: At [facility], you and your health needs are our priority.  As part of our continuing mission to provide you with exceptional heart care, we have created designated Provider Care Teams.  These Care Teams include your primary Cardiologist (physician) and Advanced Practice Providers (APPs -  Physician Assistants and Nurse Practitioners) who all work together to provide you with the care you need, when you need it.  We recommend signing up for the patient portal called "MyChart".  Sign up information is provided on this After  Visit Summary.  MyChart is used to connect with patients for Virtual Visits (Telemedicine).  Patients are able to view lab/test results, encounter notes, upcoming appointments, etc.  Non-urgent messages can be sent to your provider as well.   To learn more about what you can do with MyChart, go to [email or web address].    Your next appointment:   4 month(s)  The format for your next appointment:   In Person  Provider:   Berniece Salines, DO     Other Instructions   Important Information About Sugar

## 2022-03-06 NOTE — Progress Notes (Signed)
Cardiology Office Note:    Date:  03/06/2022   ID:  Kristen, Martinez [birth year], MRN [id number]  PCP:  Hillery Aldo, NP  Cardiologist:  Thomasene Ripple, DO  Electrophysiologist:  None   Referring MD: Hillery Aldo, NP   History of Present Illness:    Kristen Martinez is a 81 y.o. female with a hx of obstructive sleep apnea but she is no longer on CPAP this was taken away after moving to [place] from [place], hypertension has been well controlled and did not need antihypertensives recently, hyperlipidemia, prediabetes, hemoglobin A1c which was done on June 19, 2018-however noted on her medical history as diabetes type 2, mild memory loss and overactive bladder.  The patient presents to see me because she has had recent low heart rate which is stopping in the 40s without being on any AV nodal blockers.  She tells me because of the slow heart rate her PCP stopped medications for Alzheimer's. She does tell me though that she is experiencing some symptoms.  She notes that she has had intermittent chest discomfort.  She describes it as a dull sensation which is more in the left midsternal part of her chest it comes off and on.  She tells me happen at any time.  Symptoms and exertion.  Nothing makes it better or worse.  She tells me that she initially thought this was heartburn and had taken some antacids and does not fully relieve it.  She denies any significant shortness of breath.  But tells me that in the last few months she has had to go fatigue and daytime somnolence.  She feels that she can sleep anywhere during the daytime because she has not had a refreshing night sleep.  He says when she had a CPAP she actually slept well and did not have these problems.  She is concerned. She denies any lightheadedness or dizziness.  Past Medical History:  Diagnosis Date   Allergic rhinitis    Arthritis    Diabetes mellitus without complication (HCC)    GERD (gastroesophageal reflux disease)     Hypertension    Memory loss    OSA (obstructive sleep apnea)    Overactive bladder     Past Surgical History:  Procedure Laterality Date   APPENDECTOMY     CESAREAN SECTION     CHOLECYSTECTOMY N/A 07/19/2018   Procedure: LAPAROSCOPIC CHOLECYSTECTOMY;  Surgeon: Almond Lint, MD;  Location: [facility];  Service: General;  Laterality: N/A;   EYE SURGERY Bilateral    cataracts    Current Medications: Current Meds  Medication Sig   ACCU-CHEK AVIVA PLUS test strip 1 each daily.   acetaminophen (TYLENOL) 325 MG tablet Take 2 tablets (650 mg total) by mouth every 6 (six) hours as needed for mild pain, moderate pain or fever.     Allergies:   Penicillins and Prednisone   Social History   Socioeconomic History   Marital status: Widowed    Spouse name: Not on file   Number of children: 6   Years of education: 59   Highest education level: Not on file  Occupational History    Comment: retired  Tobacco Use   Smoking status: Never   Smokeless tobacco: Never   Tobacco comments:    as teen rarely  Vaping Use   Vaping Use: Never used  Substance and Sexual Activity   Alcohol use: Never   Drug use: Never   Sexual activity: Not Currently  Other Topics Concern  Not on file  Social History Narrative   01/18/20 lives alone   Social Determinants of Health   Financial Resource Strain: Not on file  Food Insecurity: Not on file  Transportation Needs: Not on file  Physical Activity: Not on file  Stress: Not on file  Social Connections: Not on file     Family History: The patient's family history includes Alzheimer's disease in her mother and sister; Breast cancer in an other family member; Diabetes in her mother and another family member; Hypertension in her father. There is no history of Stroke, CAD, or Liver disease.  ROS:   Review of Systems  Constitution: Reports fatigue and daytime somnolence.  Negative for decreased appetite, fever and weight gain.  HENT: Negative for  congestion, ear discharge, hoarse voice and sore throat.   Eyes: Negative for discharge, redness, vision loss in right eye and visual halos.  Cardiovascular: Reports chest pain.  Negative for dyspnea on exertion, leg swelling, orthopnea and palpitations.  Respiratory: Negative for cough, hemoptysis, shortness of breath and snoring.   Endocrine: Negative for heat intolerance and polyphagia.  Hematologic/Lymphatic: Negative for bleeding problem. Does not bruise/bleed easily.  Skin: Negative for flushing, nail changes, rash and suspicious lesions.  Musculoskeletal: Negative for arthritis, joint pain, muscle cramps, myalgias, neck pain and stiffness.  Gastrointestinal: Negative for abdominal pain, bowel incontinence, diarrhea and excessive appetite.  Genitourinary: Negative for decreased libido, genital sores and incomplete emptying.  Neurological: Negative for brief paralysis, focal weakness, headaches and loss of balance.  Psychiatric/Behavioral: Negative for altered mental status, depression and suicidal ideas.  Allergic/Immunologic: Negative for HIV exposure and persistent infections.    EKGs/Labs/Other Studies Reviewed:    The following studies were reviewed today:   EKG:  The ekg ordered today demonstrates    I was able to review her ZIO monitor which showed a sinus rhythm, she does have symptoms with sinus rhythm as well as rare PACs.  Minimum heart rate 41, maximum heart rate 114 bpm and average heart rate 58.  No paroxysmal supraventricular tachycardia, no atrial fibrillation, no AV blocks.  Recent Labs: No results found for requested labs within last 365 days.  Recent Lipid Panel    Component Value Date/Time   CHOL 243 (H) 07/18/2018 0520   TRIG 113 07/18/2018 0520   HDL 84 07/18/2018 0520   CHOLHDL 2.9 07/18/2018 0520   VLDL 23 07/18/2018 0520   LDLCALC 136 (H) 07/18/2018 0520    Physical Exam:    VS:  BP (!) 152/72 (BP Location: Right Arm, Patient Position: Sitting)    Pulse (!) 50   Ht  (1.575 m)   Wt 174 lb 6.4 oz (79.1 kg)   SpO2 94%   BMI 31.90 kg/m     Wt Readings from Last 3 Encounters:  03/06/22 174 lb 6.4 oz (79.1 kg)  08/31/20 177 lb (80.3 kg)  01/19/20 177 lb 3.2 oz (80.4 kg)     GEN: Well nourished, well developed in no acute distress HEENT: Normal NECK: No JVD; No carotid bruits LYMPHATICS: No lymphadenopathy CARDIAC: S1S2 noted,RRR,3/6 precordial diffuse midsystolic ejection murmurs, rubs, gallops RESPIRATORY:  Clear to auscultation without rales, wheezing or rhonchi  ABDOMEN: Soft, non-tender, non-distended, +bowel sounds, no guarding. EXTREMITIES: No edema, No cyanosis, no clubbing MUSCULOSKELETAL:  No deformity  SKIN: Warm and dry NEUROLOGIC:  Alert and oriented x 3, non-focal PSYCHIATRIC:  Normal affect, good insight  ASSESSMENT:    1. OSA (obstructive sleep apnea)   2. Prediabetes  3. Murmur, cardiac   4. Daytime somnolence   5. Fatigue, unspecified type   6. Chest pain of uncertain etiology   7. Hypertension, unspecified type   8. Mixed hyperlipidemia    PLAN:     1.  Fatigue-coupled with the daytime somnolence in the setting of her history of OSA and no longer on the CPAP I am concerned that she may be progressing now on her obstructive sleep apnea and may need to get back to using the CPAP.  Therefore I am going to go ahead and place a sleep study for the patient to reassess her for this need.  2.  Physical exam showed midsystolic ejection murmur concerning for aortic stenosis or sclerosis we will plan for an echocardiogram which has been scheduled for September 22, further recommendations after this echo.  3.  Her chest pain concerns me.  I would like to do an ischemic evaluation but I would like to wait until after her echocardiogram to see the severity and determine the best ischemic diagnostic testing for this patient.  If she does not have moderate to severe or severe aortic stenosis we will move forward  with doing an exercise nuclear stress test in this patient.  I would like the exercise nuclear stress test because I will like understandable the patient on a treadmill to get not only her functional capacity, to understand if chronotropic incompetence is playing a role here.  In addition I will able to understand if there is any exercise-induced hypertension as well. She can tolerate the treadmill-she has been using the treadmill also recently so she is okay with doing the treadmill as long as therapy people in the room with her.  4.  Her blood pressure is elevated in the office.  This appears to be an isolated finding so we will continue to monitor her blood pressure.  She will take her blood pressure at home and we will get this information to assess the need for the hypertensive medication.  If she is hypertensive at her second visit plan to start her on low-dose antihypertensive medication.   5.  We will get hemoglobin A1c today.  The patient is in agreement with the above plan. The patient left the office in stable condition.  The patient will follow up in 4 months or sooner if needed.   Medication Adjustments/Labs and Tests Ordered: Current medicines are reviewed at length with the patient today.  Concerns regarding medicines are outlined above.  Orders Placed This Encounter  Procedures   Hemoglobin A1c   EKG 12-Lead   ECHOCARDIOGRAM COMPLETE   Split night study   No orders of the defined types were placed in this encounter.   Patient Instructions  Medication Instructions:  Your physician recommends that you continue on your current medications as directed. Please refer to the Current Medication list given to you today.  *If you need a refill on your cardiac medications before your next appointment, please call your pharmacy*   Lab Work: TODAY: HgbA1c If you have labs (blood work) drawn today and your tests are completely normal, you will receive your results only by: MyChart  Message (if you have MyChart) OR A paper copy in the mail If you have any lab test that is abnormal or we need to change your treatment, we will call you to review the results.   Testing/Procedures: Your physician has requested that you have an echocardiogram. Echocardiography is a painless test that uses sound waves to create images  of your heart. It provides your doctor with information about the size and shape of your heart and how well your heart's chambers and valves are working. This procedure takes approximately one hour. There are no restrictions for this procedure.  Your physician has recommended that you have a sleep study. This test records several body functions during sleep, including: brain activity, eye movement, oxygen and carbon dioxide blood levels, heart rate and rhythm, breathing rate and rhythm, the flow of air through your mouth and nose, snoring, body muscle movements, and chest and belly movement.    Follow-Up: At [facility], you and your health needs are our priority.  As part of our continuing mission to provide you with exceptional heart care, we have created designated Provider Care Teams.  These Care Teams include your primary Cardiologist (physician) and Advanced Practice Providers (APPs -  Physician Assistants and Nurse Practitioners) who all work together to provide you with the care you need, when you need it.  We recommend signing up for the patient portal called "MyChart".  Sign up information is provided on this After Visit Summary.  MyChart is used to connect with patients for Virtual Visits (Telemedicine).  Patients are able to view lab/test results, encounter notes, upcoming appointments, etc.  Non-urgent messages can be sent to your provider as well.   To learn more about what you can do with MyChart, go to [email or web address].    Your next appointment:   4 month(s)  The format for your next appointment:   In Person  Provider:   Thomasene Ripple, DO     Other Instructions   Important Information About Sugar         Adopting a Healthy Lifestyle.  Know what a healthy weight is for you (roughly BMI <25) and aim to maintain this   Aim for 7+ servings of fruits and vegetables daily   65-80+ fluid ounces of water or unsweet tea for healthy kidneys   Limit to max 1 drink of alcohol per day; avoid smoking/tobacco   Limit animal fats in diet for cholesterol and heart health - choose grass fed whenever available   Avoid highly processed foods, and foods high in saturated/trans fats   Aim for low stress - take time to unwind and care for your mental health   Aim for 150 min of moderate intensity exercise weekly for heart health, and weights twice weekly for bone health   Aim for 7-9 hours of sleep daily   When it comes to diets, agreement about the perfect plan isnt easy to find, even among the experts. Experts at the [facility] of [employer] developed an idea known as the Healthy Eating Plate. Just imagine a plate divided into logical, healthy portions.   The emphasis is on diet quality:   Load up on vegetables and fruits - one-half of your plate: Aim for color and variety, and remember that potatoes dont count.   Go for whole grains - one-quarter of your plate: Whole wheat, barley, wheat berries, quinoa, oats, brown rice, and foods made with them. If you want pasta, go with whole wheat pasta.   Protein power - one-quarter of your plate: Fish, chicken, beans, and nuts are all healthy, versatile protein sources. Limit red meat.   The diet, however, does go beyond the plate, offering a few other suggestions.   Use healthy plant oils, such as olive, canola, soy, corn, sunflower and peanut. Check the labels, and avoid partially hydrogenated oil,  which have unhealthy trans fats.   If youre thirsty, drink water. Coffee and tea are good in moderation, but skip sugary drinks and limit milk and dairy products to  one or two daily servings.   The type of carbohydrate in the diet is more important than the amount. Some sources of carbohydrates, such as vegetables, fruits, whole grains, and beans-are healthier than others.   Finally, stay active  Signed, Berniece Salines, DO  03/06/2022 1:08 PM    [facility]

## 2022-03-10 ENCOUNTER — Ambulatory Visit (HOSPITAL_COMMUNITY): Payer: Medicare Other | Attending: Cardiology

## 2022-03-10 DIAGNOSIS — R011 Cardiac murmur, unspecified: Secondary | ICD-10-CM

## 2022-03-10 LAB — ECHOCARDIOGRAM COMPLETE
AR max vel: 1.31 cm2
AV Area VTI: 1.4 cm2
AV Area mean vel: 1.23 cm2
AV Mean grad: 8.4 mmHg
AV Peak grad: 15.7 mmHg
Ao pk vel: 1.98 m/s
Area-P 1/2: 2.62 cm2
S' Lateral: 2.5 cm

## 2022-03-14 ENCOUNTER — Other Ambulatory Visit: Payer: Self-pay

## 2022-03-14 DIAGNOSIS — R079 Chest pain, unspecified: Secondary | ICD-10-CM

## 2022-03-14 NOTE — Addendum Note (Signed)
Addended by: Orvan July on: 03/14/2022 01:51 PM   Modules accepted: Orders

## 2022-03-14 NOTE — Progress Notes (Addendum)
Order for Exercise nuclear stress test ordered per Dr. Terrial Rhodes request.

## 2022-03-15 ENCOUNTER — Telehealth (HOSPITAL_COMMUNITY): Payer: Self-pay | Admitting: *Deleted

## 2022-03-15 ENCOUNTER — Other Ambulatory Visit: Payer: Self-pay | Admitting: Cardiology

## 2022-03-15 DIAGNOSIS — R072 Precordial pain: Secondary | ICD-10-CM

## 2022-03-15 NOTE — Telephone Encounter (Signed)
Close encounter 

## 2022-03-16 ENCOUNTER — Ambulatory Visit (HOSPITAL_COMMUNITY)
Admission: RE | Admit: 2022-03-16 | Discharge: 2022-03-16 | Disposition: A | Discharge status: Home / Self Care | Payer: Medicare Other | Source: Ambulatory Visit | Attending: Cardiovascular Disease | Admitting: Cardiovascular Disease

## 2022-03-16 DIAGNOSIS — R079 Chest pain, unspecified: Secondary | ICD-10-CM | POA: Diagnosis present

## 2022-03-16 LAB — MYOCARDIAL PERFUSION IMAGING
LV dias vol: 75 mL (ref 46–106)
LV sys vol: 33 mL
Nuc Stress EF: 56 %
Peak HR: 87 {beats}/min
Rest HR: 59 {beats}/min
Rest Nuclear Isotope Dose: 10.5 mCi
SDS: 2
SRS: 0
SSS: 4
ST Depression (mm): 0 mm
Stress Nuclear Isotope Dose: 30.1 mCi
TID: 0.9

## 2022-03-16 MED ORDER — AMINOPHYLLINE 25 MG/ML IV SOLN
75.0000 mg | Freq: Once | INTRAVENOUS | Status: AC
  Administered 2022-03-16: 75 mg via INTRAVENOUS

## 2022-03-16 MED ORDER — TECHNETIUM TC 99M TETROFOSMIN IV KIT
10.5000 | PACK | Freq: Once | INTRAVENOUS | Status: AC | PRN
  Administered 2022-03-16: 10.5 via INTRAVENOUS

## 2022-03-16 MED ORDER — TECHNETIUM TC 99M TETROFOSMIN IV KIT
30.1000 | PACK | Freq: Once | INTRAVENOUS | Status: AC | PRN
  Administered 2022-03-16: 30.1 via INTRAVENOUS

## 2022-03-16 MED ORDER — REGADENOSON 0.4 MG/5ML IV SOLN
0.4000 mg | Freq: Once | INTRAVENOUS | Status: AC
  Administered 2022-03-16: 0.4 mg via INTRAVENOUS

## 2022-04-17 ENCOUNTER — Ambulatory Visit (HOSPITAL_BASED_OUTPATIENT_CLINIC_OR_DEPARTMENT_OTHER): Payer: Medicare Other | Attending: Cardiology | Admitting: Cardiovascular Disease

## 2022-04-17 DIAGNOSIS — G4736 Sleep related hypoventilation in conditions classified elsewhere: Secondary | ICD-10-CM

## 2022-04-17 DIAGNOSIS — R5383 Other fatigue: Secondary | ICD-10-CM | POA: Insufficient documentation

## 2022-04-17 DIAGNOSIS — R400 Somnolence: Secondary | ICD-10-CM | POA: Diagnosis not present

## 2022-04-17 DIAGNOSIS — G4733 Obstructive sleep apnea (adult) (pediatric): Secondary | ICD-10-CM | POA: Insufficient documentation

## 2022-04-20 NOTE — Progress Notes (Deleted)
Cardiology Office Note    Date:  04/20/2022   ID:  Ileta, Ofarrell Mar 30, 1941, MRN [id number]  PCP:  Cipriano Mile, NP  Cardiologist:  Shelva Majestic, MD   No chief complaint on file.   History of Present Illness:  Kristen Martinez is a 81 y.o. female ***    Past Medical History:  Diagnosis Date   Allergic rhinitis    Arthritis    Diabetes mellitus without complication (HCC)    GERD (gastroesophageal reflux disease)    Hypertension    Memory loss    OSA (obstructive sleep apnea)    Overactive bladder     Past Surgical History:  Procedure Laterality Date   APPENDECTOMY     CESAREAN SECTION     CHOLECYSTECTOMY N/A 07/19/2018   Procedure:  LAPAROSCOPIC CHOLECYSTECTOMY;  Surgeon: Stark Klein, MD;  Location: [facility];  Service: General;  Laterality: N/A;   EYE SURGERY Bilateral    cataracts    Current Medications: Outpatient Medications Prior to Visit  Medication Sig Dispense Refill   ACCU-CHEK AVIVA PLUS test strip 1 each daily.     acetaminophen (TYLENOL) 325 MG tablet Take 2 tablets (650 mg total) by mouth every 6 (six) hours as needed for mild pain, moderate pain or fever.     albuterol (PROVENTIL HFA;VENTOLIN HFA) 108 (90 Base) MCG/ACT inhaler Inhale 2 puffs into the lungs every 6 (six) hours as needed for wheezing or shortness of breath. (Patient not taking: Reported on 03/06/2022) 1 Inhaler 0   calcium carbonate (TUMS - DOSED IN MG ELEMENTAL CALCIUM) 500 MG chewable tablet Chew 1 tablet by mouth at bedtime. (Patient not taking: Reported on 03/06/2022)     citalopram (CELEXA) 10 MG tablet Take 10 mg by mouth daily. (Patient not taking: Reported on 03/06/2022)     donepezil (ARICEPT) 10 MG tablet Take 10 mg by mouth at bedtime. (Patient not taking: Reported on 03/06/2022)     DULoxetine (CYMBALTA) 30 MG capsule Take 30 mg by mouth daily. (Patient not taking: Reported on 03/06/2022)     esomeprazole (NEXIUM) 20 MG capsule Take 20 mg by mouth daily. (Patient not taking: Reported on 03/06/2022)     mirabegron ER (MYRBETRIQ) 25 MG TB24 tablet Take 1 tablet (25 mg total) by mouth daily. (Patient not taking: Reported on 08/31/2020) 90 tablet 2   traZODone (DESYREL) 50 MG tablet Take 50 mg by mouth at bedtime. (Patient not taking: Reported on 03/06/2022)     UNABLE TO FIND SMARTSIG:1 Drop(s) In Eye(s) Every Evening (Patient not taking: Reported on 03/06/2022)     No facility-administered medications prior to visit.     Allergies:   Penicillins and Prednisone   Social History   Socioeconomic History   Marital status: Widowed    Spouse name: Not on file   Number of children: 6   Years of education: 12   Highest education level: Not on  file  Occupational History    Comment: retired  Tobacco Use   Smoking status: Never   Smokeless tobacco: Never   Tobacco comments:    as teen rarely  Vaping Use   Vaping Use: Never used  Substance and Sexual Activity   Alcohol use: Never   Drug use: Never   Sexual activity: Not Currently  Other Topics Concern   Not on file  Social History Narrative   01/18/20 lives alone   Social Determinants of Health   Financial Resource Strain: Not on file  Food Insecurity: Not  on file  Transportation Needs: Not on file  Physical Activity: Not on file  Stress: Not on file  Social Connections: Not on file     Family History:  The patient's ***family history includes Alzheimer's disease in her mother and sister; Breast cancer in an other family member; Diabetes in her mother and another family member; Hypertension in her father.   ROS General: Negative; No fevers, chills, or night sweats;  HEENT: Negative; No changes in vision or hearing, sinus congestion, difficulty swallowing Pulmonary: Negative; No cough, wheezing, shortness of breath, hemoptysis Cardiovascular: Negative; No chest pain, presyncope, syncope, palpitations GI: Negative; No nausea, vomiting, diarrhea, or abdominal pain GU: Negative; No dysuria, hematuria, or difficulty voiding Musculoskeletal: Negative; no myalgias, joint pain, or weakness Hematologic/Oncology: Negative; no easy bruising, bleeding Endocrine: Negative; no heat/cold intolerance; no diabetes Neuro: Negative; no changes in balance, headaches Skin: Negative; No rashes or skin lesions Psychiatric: Negative; No behavioral problems, depression Sleep: Negative; No snoring, daytime sleepiness, hypersomnolence, bruxism, restless legs, hypnogognic hallucinations, no cataplexy Other comprehensive 14 point system review is negative.   PHYSICAL EXAM:   VS:  Ht _0  (1.575 m)   Wt 167 lb (75.8 kg)   BMI 30.54 kg/m    Wt Readings from Last 3 Encounters:  04/17/22  167 lb (75.8 kg)  03/16/22 174 lb (78.9 kg)  03/06/22 174 lb 6.4 oz (79.1 kg)    General: Alert, oriented, no distress.  Skin: normal turgor, no rashes, warm and dry HEENT: Normocephalic, atraumatic. Pupils equal round and reactive to light; sclera anicteric; extraocular muscles intact; Fundi ** Nose without nasal septal hypertrophy Mouth/Parynx benign; Mallinpatti scale Neck: No JVD, no carotid bruits; normal carotid upstroke Lungs: clear to ausculatation and percussion; no wheezing or rales Chest wall: without tenderness to palpitation Heart: PMI not displaced, RRR, s1 s2 normal, 1/6 systolic murmur, no diastolic murmur, no rubs, gallops, thrills, or heaves Abdomen: soft, nontender; no hepatosplenomehaly, BS+; abdominal aorta nontender and not dilated by palpation. Back: no CVA tenderness Pulses 2+ Musculoskeletal: full range of motion, normal strength, no joint deformities Extremities: no clubbing cyanosis or edema, Homan's sign negative  Neurologic: grossly nonfocal; Cranial nerves grossly wnl Psychologic: Normal mood and affect   Studies/Labs Reviewed:   EKG:  EKG is*** ordered today.  The ekg ordered today demonstrates ***  Recent Labs:    Latest Ref Rng & Units 07/28/2018    6:09 AM 07/27/2018    6:04 AM 07/26/2018    6:20 AM  BMP  Glucose 70 - 99 mg/dL 127  103  120   BUN 8 - 23 mg/dL _1 Creatinine 0.44 - 1.00 mg/dL 0.75  0.83  0.88   Sodium 135 - 145 mmol/L 139  139  139   Potassium 3.5 - 5.1 mmol/L 3.9  3.7  3.8   Chloride 98 - 111 mmol/L 106  105  105   CO2 22 - 32 mmol/L _2 Calcium 8.9 - 10.3 mg/dL 8.5  8.7  8.6         Latest Ref Rng & Units 07/28/2018    6:09 AM 07/27/2018    6:04 AM 07/24/2018    6:27 AM  Hepatic Function  Total Protein 6.5 - 8.1 g/dL 5.8  5.7  5.5   Albumin 3.5 - 5.0 g/dL 2.4  2.2  2.1   AST 15 - 41 U/L 32  49  31   ALT 0 - 44  U/L _0 Alk Phosphatase 38 - 126 U/L 148  152  138   Total Bilirubin 0.3 - 1.2 mg/dL 0.8   1.2  0.9   Bilirubin, Direct 0.0 - 0.2 mg/dL   0.5        Latest Ref Rng & Units 07/29/2018    6:10 AM 07/28/2018    6:09 AM 07/27/2018    6:04 AM  CBC  WBC 4.0 - 10.5 K/uL 10.7  9.8  14.4   Hemoglobin 12.0 - 15.0 g/dL 7.9  7.8  8.0   Hematocrit 36.0 - 46.0 % 26.1  26.1  26.0   Platelets 150 - 400 K/uL 569  562  491    Lab Results  Component Value Date   MCV 90.3 07/29/2018   MCV 90.6 07/28/2018   MCV 91.5 07/27/2018   Lab Results  Component Value Date   TSH 0.564 07/18/2018   Lab Results  Component Value Date   HGBA1C 6.2 (H) 03/06/2022     BNP No results found for: "BNP"  ProBNP No results found for: "PROBNP"   Lipid Panel     Component Value Date/Time   CHOL 243 (H) 07/18/2018 0520   TRIG 113 07/18/2018 0520   HDL 84 07/18/2018 0520   CHOLHDL 2.9 07/18/2018 0520   VLDL 23 07/18/2018 0520   LDLCALC 136 (H) 07/18/2018 0520     RADIOLOGY: SLEEP STUDY DOCUMENTS  Result Date: 04/18/2022 Ordered by an unspecified provider.    Additional studies/ records that were reviewed today include:  ***    ASSESSMENT:    1. OSA (obstructive sleep apnea)   2. Daytime somnolence   3. Fatigue, unspecified type      PLAN:  ***   Medication Adjustments/Labs and Tests Ordered: Current medicines are reviewed at length with the patient today.  Concerns regarding medicines are outlined above.  Medication changes, Labs and Tests ordered today are listed in the Patient Instructions below. There are no Patient Instructions on file for this visit.   Signed, Shelva Majestic, MD  04/20/2022 4:55 PM    [facility] [address], [place], [place], [place]  [address] Phone: [phone]

## 2022-04-27 ENCOUNTER — Encounter (HOSPITAL_BASED_OUTPATIENT_CLINIC_OR_DEPARTMENT_OTHER): Payer: Self-pay | Admitting: Cardiovascular Disease

## 2022-04-27 NOTE — Procedures (Signed)
Patient Name: Kristen Martinez, Kristen Martinez Date: 04/17/2022 Gender: Female D.O.B: 1941/06/04 Age (years): 17 Referring Provider: Lavona Mound Tobb DO Height (inches): 62 Interpreting Physician: Nicki Guadalajara MD, ABSM Weight (lbs): 167 RPSGT: Ulyess Mort BMI: 31 MRN: [id number] Neck Size: 14.50  CLINICAL INFORMATION Sleep Study Type: NPSG  Indication for sleep study: Hypertension, Obesity, h/o OSA not on therapy  Epworth Sleepiness Score: 0  SLEEP STUDY TECHNIQUE As per the AASM Manual for the Scoring of Sleep and Associated Events v2.3 (April 2016) with a hypopnea requiring 4% desaturations.  The channels recorded and monitored were frontal, central and occipital EEG, electrooculogram (EOG), submentalis EMG (chin), nasal and oral airflow, thoracic and abdominal wall motion, anterior tibialis EMG, snore microphone, electrocardiogram, and pulse oximetry.  MEDICATIONS acetaminophen (TYLENOL) 325 MG tablet albuterol (PROVENTIL HFA;VENTOLIN HFA) 108 (90 Base) MCG/ACT inhaler calcium carbonate (TUMS - DOSED IN MG ELEMENTAL CALCIUM) 500 MG chewable tablet citalopram (CELEXA) 10 MG tablet donepezil (ARICEPT) 10 MG tablet DULoxetine (CYMBALTA) 30 MG capsule esomeprazole (NEXIUM) 20 MG capsule mirabegron ER (MYRBETRIQ) 25 MG TB24 tablet traZODone (DESYREL) 50 MG tablet Medications self-administered by patient taken the night of the study : ZZZ QUIL  SLEEP ARCHITECTURE The study was initiated at 10:08:40 PM and ended at 4:44:37 AM.  Sleep onset time was 14.2 minutes and the sleep efficiency was 89.0%%. The total sleep time was 352.5 minutes.  Stage REM latency was 83.5 minutes.  The patient spent 16.0%% of the night in stage N1 sleep, 60.3%% in stage N2 sleep, 0.0%% in stage N3 and 23.7% in REM.  Alpha intrusion was absent.  Supine sleep was 55.60%.  RESPIRATORY PARAMETERS The overall apnea/hypopnea index (AHI) was 6.0 per hour. The respiratory disturbance index (RDI) was  53.3/h. There were 4 total apneas, including 3 obstructive, 1 central and 0 mixed apneas. There were 31 hypopneas and 278 RERAs.  The AHI during Stage REM sleep was 11.5 per hour.  AHI while supine was 7.0 per hour.  The mean oxygen saturation was 93.4%. The minimum SpO2 during sleep was 81.0%.  Moderate snoring was noted during this study.  CARDIAC DATA The 2 lead EKG demonstrated sinus rhythm. The mean heart rate was 48.4 beats per minute. Other EKG findings include: None.  LEG MOVEMENT DATA The total PLMS were 0 with a resulting PLMS index of 0.0. Associated arousal with leg movement index was 0.2 .  IMPRESSIONS - Mild obstructive sleep apnea occurred during this study (AHI  6.0/h, with RDI 53.3/h); events were worse with REM sleep (AHI 11.5/h). - No significant central sleep apnea occurred during this study (CAI 0.2/h). - Moderate  oxygen desaturation to a nadir of 81.0%. - The patient snored with moderate snoring volume. - No cardiac abnormalities were noted during this study. - Clinically significant periodic limb movements did not occur during sleep. No significant associated arousals.  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  If unable to obtain an in-lab titration, initiate Auto-PAP with EPR of 3 at 6-16 cm of water.  - Effort should be made to optimize nasal and oropharyngeal patency. - Positional therapy avoiding supine position during sleep. - Avoid alcohol, sedatives and other CNS depressants that may worsen sleep apnea and disrupt normal sleep architecture. - Sleep hygiene should be reviewed to assess factors that may improve sleep quality. - Weight management (BMI 31) and regular exercise should be initiated or continued  if appropriate.  [Electronically signed] 04/27/2022 08:27 AM  Nicki Guadalajara MD, [facility], ABSM Diplomate, American Board  of Sleep Medicine  NPI: [id number]   [facility] PH: [phone]   FX: [phone] ACCREDITED BY THE AMERICAN ACADEMY OF SLEEP MEDICINE

## 2022-05-01 ENCOUNTER — Telehealth: Payer: Self-pay | Admitting: *Deleted

## 2022-05-01 NOTE — Telephone Encounter (Signed)
Message left to return a call to discuss sleep study results. 

## 2022-05-01 NOTE — Telephone Encounter (Signed)
-----   Message from Lennette Bihari, MD sent at 04/27/2022  8:32 AM EST ----- Burna Mortimer, please notify pt of results; Can initiate Auto-PAP or in-lab titration

## 2022-05-02 NOTE — Telephone Encounter (Signed)
Patient contacted and given sleep study results and recommendations. She agrees to proceed with starting CPAP. Order will be sent to [facility] via [facility] portal.

## 2022-05-02 NOTE — Telephone Encounter (Signed)
-----   Message from Thomas A Kelly, MD sent at 04/27/2022  8:32 AM EST ----- Jakari Sada, please notify pt of results; Can initiate Auto-PAP or in-lab titration 

## 2022-05-02 NOTE — Telephone Encounter (Signed)
Pt returning call  Best callback: [phone]

## 2022-05-08 ENCOUNTER — Other Ambulatory Visit: Payer: Self-pay | Admitting: Student

## 2022-05-08 DIAGNOSIS — M7501 Adhesive capsulitis of right shoulder: Secondary | ICD-10-CM

## 2022-05-17 ENCOUNTER — Encounter: Payer: Self-pay | Admitting: Cardiology

## 2022-05-17 DIAGNOSIS — F0280 Dementia in other diseases classified elsewhere without behavioral disturbance: Secondary | ICD-10-CM

## 2022-05-18 ENCOUNTER — Ambulatory Visit: Payer: Medicare Other | Admitting: Orthopedic Surgery

## 2022-05-21 ENCOUNTER — Ambulatory Visit
Admission: RE | Admit: 2022-05-21 | Discharge: 2022-05-21 | Disposition: A | Discharge status: Home / Self Care | Payer: Medicare Other | Source: Ambulatory Visit | Attending: Student | Admitting: Student

## 2022-05-21 DIAGNOSIS — M7501 Adhesive capsulitis of right shoulder: Secondary | ICD-10-CM

## 2022-06-09 ENCOUNTER — Ambulatory Visit (INDEPENDENT_AMBULATORY_CARE_PROVIDER_SITE_OTHER): Payer: Medicare Other | Admitting: Orthopaedic Surgery

## 2022-06-09 DIAGNOSIS — M25511 Pain in right shoulder: Secondary | ICD-10-CM | POA: Diagnosis not present

## 2022-06-09 DIAGNOSIS — G8929 Other chronic pain: Secondary | ICD-10-CM

## 2022-06-09 NOTE — Progress Notes (Signed)
Office Visit Note   Patient: Kristen Martinez           Date of Birth: 1940/12/15           MRN: [id number] Visit Date: 06/09/2022              Requested by: Cipriano Mile, NP [place],  [place] [address] PCP: Cipriano Mile, NP   Assessment & Plan: Visit Diagnoses:  1. Chronic right shoulder pain     Plan: MRI of the right shoulder reviewed with the patient in detail which shows severe tendinosis of the biceps tendon, AC joint arthritis, impingement syndrome, rotator cuff tendinopathy.  I do not see any evidence of adhesive capsulitis.  There is a subcutaneous ganglion cyst that is likely coming from the [facility] joint.  Disease process explained to the patient and her daughter.  Treatment options were reviewed to include cortisone injection, over-the-counter medications, physical therapy/home exercise program, surgical debridement.  Based on her options she would like to try over-the-counter medications and home exercise program.  Follow-up as needed.  Follow-Up Instructions: No follow-ups on file.   Orders:  No orders of the defined types were placed in this encounter.  No orders of the defined types were placed in this encounter.     Procedures: No procedures performed   Clinical Data: No additional findings.   Subjective: Chief Complaint  Patient presents with   Right Shoulder - Pain    HPI Kristen Martinez is a 81 year old female here for evaluation of chronic right shoulder pain.  Referral from Cipriano Mile her PCP.  She began having right shoulder pain after flu shot on 03/08/2022.  Denies any injuries.  Initially had a decrease in range of motion but has gotten a little bit better.  She has been using Salonpas patches.  Recently had an MRI.  Review of Systems  Constitutional: Negative.   HENT: Negative.    Eyes: Negative.   Respiratory: Negative.    Cardiovascular: Negative.   Endocrine: Negative.   Musculoskeletal: Negative.   Neurological: Negative.    Hematological: Negative.   Psychiatric/Behavioral: Negative.    All other systems reviewed and are negative.    Objective: Vital Signs: There were no vitals taken for this visit.  Physical Exam Vitals and nursing note reviewed.  Constitutional:      Appearance: She is well-developed.  HENT:     Head: Atraumatic.     Nose: Nose normal.  Eyes:     Extraocular Movements: Extraocular movements intact.  Cardiovascular:     Pulses: Normal pulses.  Pulmonary:     Effort: Pulmonary effort is normal.  Abdominal:     Palpations: Abdomen is soft.  Musculoskeletal:     Cervical back: Neck supple.  Skin:    General: Skin is warm.     Capillary Refill: Capillary refill takes less than 2 seconds.  Neurological:     Mental Status: She is alert. Mental status is at baseline.  Psychiatric:        Behavior: Behavior normal.        Thought Content: Thought content normal.        Judgment: Judgment normal.     Ortho Exam Examination right shoulder shows mild to moderate pain with passive range of motion.  Passive flexion to 175 degrees.  Passive external rotation to 60 degrees and passive abduction to 110 degrees.  She has a moderate pain with manual muscle testing of the rotator cuff and biceps.  Pain  with impingement testing. Specialty Comments:  No specialty comments available.  Imaging: No results found.   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   Ascites 07/17/2018   Elevated LFTs 07/17/2018   Obesity (BMI 30-39.9) 07/17/2018   Diabetes mellitus without complication (HCC)    Hypertension    Past Medical History:  Diagnosis Date   Allergic rhinitis    Arthritis     Diabetes mellitus without complication (HCC)    GERD (gastroesophageal reflux disease)    Hypertension    Memory loss    OSA (obstructive sleep apnea)    Overactive bladder     Family History  Problem Relation Age of Onset   Diabetes Mother    Alzheimer's disease Mother    Hypertension Father    Breast cancer Other    Diabetes Other    Alzheimer's disease Sister    Stroke Neg Hx    CAD Neg Hx    Liver disease Neg Hx     Past Surgical History:  Procedure Laterality Date   APPENDECTOMY     CESAREAN SECTION     CHOLECYSTECTOMY N/A 07/19/2018   Procedure: LAPAROSCOPIC CHOLECYSTECTOMY;  Surgeon: Almond Lint, MD;  Location: [facility];  Service: General;  Laterality: N/A;   EYE SURGERY Bilateral    cataracts   Social History   Occupational History    Comment: retired  Tobacco Use   Smoking status: Never   Smokeless tobacco: Never   Tobacco comments:    as teen rarely  Vaping Use   Vaping Use: Never used  Substance and Sexual Activity   Alcohol use: Never   Drug use: Never   Sexual activity: Not Currently

## 2022-07-07 ENCOUNTER — Encounter: Payer: Self-pay | Admitting: Cardiology

## 2022-07-07 ENCOUNTER — Ambulatory Visit: Payer: Medicare Other | Attending: Cardiology | Admitting: Cardiology

## 2022-07-07 VITALS — BP 120/86 | HR 54 | Ht 62.0 in | Wt 175.6 lb

## 2022-07-07 DIAGNOSIS — G4733 Obstructive sleep apnea (adult) (pediatric): Secondary | ICD-10-CM | POA: Diagnosis not present

## 2022-07-07 DIAGNOSIS — E669 Obesity, unspecified: Secondary | ICD-10-CM | POA: Diagnosis not present

## 2022-07-07 NOTE — Progress Notes (Signed)
Cardiology Office Note:    Date:  07/08/2022   ID:  Kristen Martinez, Kristen Martinez 04/25/1941, MRN [id number]  PCP:  Cipriano Mile, NP  Cardiologist:  Berniece Salines, DO  Electrophysiologist:  None   Referring MD: Cipriano Mile, NP   History of Present Illness:    Kristen Martinez is a 82 y.o. female with a hx of obstructive sleep apnea but she is no longer on CPAP this was taken away after moving to [place] from [place], hypertension has been well controlled and did not need antihypertensives recently, hyperlipidemia, prediabetes, hemoglobin A1c which was done on June 19, 2018-however noted on her medical history as diabetes type 2, mild memory loss and overactive bladder. She At her last visit she was experiencing fatigue daytime somnolence and the patient for sleep study which diagnosed sleep apnea, we also get an echocardiogram which was normal and her exercise nuclear stress test did not show any evidence of ischemia.  Since her visit she has started her CPAP.  In terms of her symptoms she tells me she is feeling a lot better she has transitioned off some of her medications.  And is happy about this.  She sees her primary care doctor in the next couple days she tells me.  Blood work will be done at a time.  Past Medical History:  Diagnosis Date   Allergic rhinitis    Arthritis    Diabetes mellitus without complication (HCC)    GERD (gastroesophageal reflux disease)    Hypertension    Memory loss    OSA (obstructive sleep apnea)    Overactive bladder     Past Surgical History:  Procedure Laterality Date   APPENDECTOMY     CESAREAN SECTION     CHOLECYSTECTOMY N/A 07/19/2018   Procedure: LAPAROSCOPIC CHOLECYSTECTOMY;  Surgeon: Stark Klein, MD;  Location: [facility];  Service: General;  Laterality: N/A;   EYE SURGERY Bilateral    cataracts    Current Medications: No outpatient medications have been marked as taking for the 07/07/22 encounter (Office Visit) with Berniece Salines, DO.      Allergies:   Penicillins and Prednisone   Social History   Socioeconomic History   Marital status: Widowed    Spouse name: Not on file   Number of children: 6   Years of education: 50   Highest education level: Not on file  Occupational History    Comment: retired  Tobacco Use   Smoking status: Never   Smokeless tobacco: Never   Tobacco comments:    as teen rarely  Vaping Use   Vaping Use: Never used  Substance and Sexual Activity   Alcohol use: Never   Drug use: Never   Sexual activity: Not Currently  Other Topics Concern   Not on file  Social History Narrative   01/18/20 lives alone   Social Determinants of Health   Financial Resource Strain: Not on file  Food Insecurity: Not on file  Transportation Needs: Not on file  Physical Activity: Not on file  Stress: Not on file  Social Connections: Not on file     Family History: The patient's family history includes Alzheimer's disease in her mother and sister; Breast cancer in an other family member; Diabetes in her mother and another family member; Hypertension in her father. There is no history of Stroke, CAD, or Liver disease.  ROS:   Review of Systems  Constitution: Reports fatigue and daytime somnolence.  Negative for decreased appetite, fever and weight gain.  HENT: Negative for congestion, ear discharge, hoarse voice and sore throat.   Eyes: Negative for discharge, redness, vision loss in right eye and visual halos.  Cardiovascular: Reports chest pain.  Negative for dyspnea on exertion, leg swelling, orthopnea and palpitations.  Respiratory: Negative for cough, hemoptysis, shortness of breath and snoring.   Endocrine: Negative for heat intolerance and polyphagia.  Hematologic/Lymphatic: Negative for bleeding problem. Does not bruise/bleed easily.  Skin: Negative for flushing, nail changes, rash and suspicious lesions.  Musculoskeletal: Negative for arthritis, joint pain, muscle cramps, myalgias, neck pain  and stiffness.  Gastrointestinal: Negative for abdominal pain, bowel incontinence, diarrhea and excessive appetite.  Genitourinary: Negative for decreased libido, genital sores and incomplete emptying.  Neurological: Negative for brief paralysis, focal weakness, headaches and loss of balance.  Psychiatric/Behavioral: Negative for altered mental status, depression and suicidal ideas.  Allergic/Immunologic: Negative for HIV exposure and persistent infections.    EKGs/Labs/Other Studies Reviewed:    The following studies were reviewed today:   EKG:  The ekg ordered today demonstrates    I was able to review her ZIO monitor which showed a sinus rhythm, she does have symptoms with sinus rhythm as well as rare PACs.  Minimum heart rate 41, maximum heart rate 114 bpm and average heart rate 58.  No paroxysmal supraventricular tachycardia, no atrial fibrillation, no AV blocks.  Recent Labs: No results found for requested labs within last 365 days.  Recent Lipid Panel    Component Value Date/Time   CHOL 243 (H) 07/18/2018 0520   TRIG 113 07/18/2018 0520   HDL 84 07/18/2018 0520   CHOLHDL 2.9 07/18/2018 0520   VLDL 23 07/18/2018 0520   LDLCALC 136 (H) 07/18/2018 0520    Physical Exam:    VS:  BP 120/86   Pulse (!) 54   Ht 5\' 2"  (1.575 m)   Wt 79.7 kg   SpO2 98%   BMI 32.12 kg/m     Wt Readings from Last 3 Encounters:  07/07/22 79.7 kg  04/17/22 75.8 kg  03/16/22 78.9 kg     GEN: Well nourished, well developed in no acute distress HEENT: Normal NECK: No JVD; No carotid bruits LYMPHATICS: No lymphadenopathy CARDIAC: S1S2 noted,RRR,3/6 precordial diffuse midsystolic ejection murmurs, rubs, gallops RESPIRATORY:  Clear to auscultation without rales, wheezing or rhonchi  ABDOMEN: Soft, non-tender, non-distended, +bowel sounds, no guarding. EXTREMITIES: No edema, No cyanosis, no clubbing MUSCULOSKELETAL:  No deformity  SKIN: Warm and dry NEUROLOGIC:  Alert and oriented x 3,  non-focal PSYCHIATRIC:  Normal affect, good insight  ASSESSMENT:    1. OSA on CPAP   2. Obesity (BMI 30-39.9)     PLAN:    Will have her continue her CPAP.  She does have dry mouth as a result of using this.  I have talked the patient back in humidifier.  She is still active.  No symptoms of from a cardiovascular standpoint.   The patient is in agreement with the above plan. The patient left the office in stable condition.  The patient will follow up in 12 months or sooner if needed.   Medication Adjustments/Labs and Tests Ordered: Current medicines are reviewed at length with the patient today.  Concerns regarding medicines are outlined above.  No orders of the defined types were placed in this encounter.  No orders of the defined types were placed in this encounter.   Patient Instructions  Medication Instructions:  Your physician recommends that you continue on your current medications as directed.  Please refer to the Current Medication list given to you today.  *If you need a refill on your cardiac medications before your next appointment, please call your pharmacy*   Lab Work: None   Testing/Procedures: None   Follow-Up: At [facility], you and your health needs are our priority.  As part of our continuing mission to provide you with exceptional heart care, we have created designated Provider Care Teams.  These Care Teams include your primary Cardiologist (physician) and Advanced Practice Providers (APPs -  Physician Assistants and Nurse Practitioners) who all work together to provide you with the care you need, when you need it.  We recommend signing up for the patient portal called "MyChart".  Sign up information is provided on this After Visit Summary.  MyChart is used to connect with patients for Virtual Visits (Telemedicine).  Patients are able to view lab/test results, encounter notes, upcoming appointments, etc.  Non-urgent messages can be sent to  your provider as well.   To learn more about what you can do with MyChart, go to [email or web address].    Your next appointment:   1 year(s)  Provider:   Thomasene Ripple, DO     Other Instructions     Adopting a Healthy Lifestyle.  Know what a healthy weight is for you (roughly BMI <25) and aim to maintain this   Aim for 7+ servings of fruits and vegetables daily   65-80+ fluid ounces of water or unsweet tea for healthy kidneys   Limit to max 1 drink of alcohol per day; avoid smoking/tobacco   Limit animal fats in diet for cholesterol and heart health - choose grass fed whenever available   Avoid highly processed foods, and foods high in saturated/trans fats   Aim for low stress - take time to unwind and care for your mental health   Aim for 150 min of moderate intensity exercise weekly for heart health, and weights twice weekly for bone health   Aim for 7-9 hours of sleep daily   When it comes to diets, agreement about the perfect plan isnt easy to find, even among the experts. Experts at the [facility] [employer] developed an idea known as the Healthy Eating Plate. Just imagine a plate divided into logical, healthy portions.   The emphasis is on diet quality:   Load up on vegetables and fruits - one-half of your plate: Aim for color and variety, and remember that potatoes dont count.   Go for whole grains - one-quarter of your plate: Whole wheat, barley, wheat berries, quinoa, oats, brown rice, and foods made with them. If you want pasta, go with whole wheat pasta.   Protein power - one-quarter of your plate: Fish, chicken, beans, and nuts are all healthy, versatile protein sources. Limit red meat.   The diet, however, does go beyond the plate, offering a few other suggestions.   Use healthy plant oils, such as olive, canola, soy, corn, sunflower and peanut. Check the labels, and avoid partially hydrogenated oil, which have unhealthy trans fats.   If youre  thirsty, drink water. Coffee and tea are good in moderation, but skip sugary drinks and limit milk and dairy products to one or two daily servings.   The type of carbohydrate in the diet is more important than the amount. Some sources of carbohydrates, such as vegetables, fruits, whole grains, and beans-are healthier than others.   Finally, stay active  Signed, Thomasene Ripple, DO  07/08/2022 9:50 PM  [facility]

## 2022-07-07 NOTE — Patient Instructions (Signed)
Medication Instructions:  Your physician recommends that you continue on your current medications as directed. Please refer to the Current Medication list given to you today.  *If you need a refill on your cardiac medications before your next appointment, please call your pharmacy*   Lab Work: None   Testing/Procedures: None   Follow-Up: At [facility], you and your health needs are our priority.  As part of our continuing mission to provide you with exceptional heart care, we have created designated Provider Care Teams.  These Care Teams include your primary Cardiologist (physician) and Advanced Practice Providers (APPs -  Physician Assistants and Nurse Practitioners) who all work together to provide you with the care you need, when you need it.  We recommend signing up for the patient portal called "MyChart".  Sign up information is provided on this After Visit Summary.  MyChart is used to connect with patients for Virtual Visits (Telemedicine).  Patients are able to view lab/test results, encounter notes, upcoming appointments, etc.  Non-urgent messages can be sent to your provider as well.   To learn more about what you can do with MyChart, go to [email or web address].    Your next appointment:   1 year(s)  Provider:   Berniece Salines, DO     Other Instructions

## 2022-08-31 ENCOUNTER — Encounter: Payer: Self-pay | Admitting: Student

## 2022-12-07 ENCOUNTER — Ambulatory Visit: Payer: Medicare Other | Admitting: Gastroenterology

## 2023-01-29 ENCOUNTER — Emergency Department (HOSPITAL_COMMUNITY): Payer: Medicare Other

## 2023-01-29 ENCOUNTER — Emergency Department (HOSPITAL_COMMUNITY)
Admission: EM | Admit: 2023-01-29 | Discharge: 2023-01-29 | Disposition: A | Discharge status: Home / Self Care | Payer: Medicare Other | Attending: Emergency Medicine | Admitting: Emergency Medicine

## 2023-01-29 ENCOUNTER — Other Ambulatory Visit: Payer: Self-pay

## 2023-01-29 ENCOUNTER — Encounter (HOSPITAL_COMMUNITY): Payer: Self-pay

## 2023-01-29 DIAGNOSIS — M25522 Pain in left elbow: Secondary | ICD-10-CM | POA: Insufficient documentation

## 2023-01-29 DIAGNOSIS — M25532 Pain in left wrist: Secondary | ICD-10-CM | POA: Diagnosis not present

## 2023-01-29 DIAGNOSIS — M25531 Pain in right wrist: Secondary | ICD-10-CM | POA: Insufficient documentation

## 2023-01-29 DIAGNOSIS — M25552 Pain in left hip: Secondary | ICD-10-CM | POA: Diagnosis not present

## 2023-01-29 DIAGNOSIS — R001 Bradycardia, unspecified: Secondary | ICD-10-CM | POA: Insufficient documentation

## 2023-01-29 DIAGNOSIS — M25551 Pain in right hip: Secondary | ICD-10-CM | POA: Insufficient documentation

## 2023-01-29 DIAGNOSIS — M25521 Pain in right elbow: Secondary | ICD-10-CM | POA: Insufficient documentation

## 2023-01-29 LAB — BASIC METABOLIC PANEL
Anion gap: 11 (ref 5–15)
BUN: <5 mg/dL — ABNORMAL LOW (ref 8–23)
CO2: 22 mmol/L (ref 22–32)
Calcium: 9.7 mg/dL (ref 8.9–10.3)
Chloride: 104 mmol/L (ref 98–111)
Creatinine, Ser: 0.91 mg/dL (ref 0.44–1.00)
GFR, Estimated: >60 mL/min (ref >60)
Glucose, Bld: 92 mg/dL (ref 70–99)
Potassium: 3.9 mmol/L (ref 3.5–5.1)
Sodium: 137 mmol/L (ref 135–145)

## 2023-01-29 LAB — CBC
HCT: 41.8 % (ref 36.0–46.0)
Hemoglobin: 12.7 g/dL (ref 12.0–15.0)
MCH: 28.1 pg (ref 26.0–34.0)
MCHC: 30.4 g/dL (ref 30.0–36.0)
MCV: 92.5 fL (ref 80.0–100.0)
Platelets: 404 10*3/uL — ABNORMAL HIGH (ref 150–400)
RBC: 4.52 MIL/uL (ref 3.87–5.11)
RDW: 14.3 % (ref 11.5–15.5)
WBC: 7.5 10*3/uL (ref 4.0–10.5)
nRBC: 0 % (ref 0.0–0.2)

## 2023-01-29 LAB — URINALYSIS, DIPSTICK ONLY
Bilirubin Urine: NEGATIVE
Glucose, UA: NEGATIVE mg/dL
Hgb urine dipstick: NEGATIVE
Ketones, ur: 5 mg/dL — AB
Nitrite: NEGATIVE
Protein, ur: NEGATIVE mg/dL
Specific Gravity, Urine: 1.003 — ABNORMAL LOW (ref 1.005–1.030)
pH: 6 (ref 5.0–8.0)

## 2023-01-29 LAB — TROPONIN I (HIGH SENSITIVITY)
Troponin I (High Sensitivity): 7 ng/L (ref <18)
Troponin I (High Sensitivity): 7 ng/L (ref <18)

## 2023-01-29 NOTE — Discharge Instructions (Addendum)
The pain in your hips is likely arthritis.  You can take Tylenol and ibuprofen for this at home.  The rest of your results were normal.  You do not have an infection in your urine.  You can follow-up with your primary care doctor.

## 2023-01-29 NOTE — ED Triage Notes (Addendum)
Pt came in via POV d/t pains felt in her Rt knee, Rt ankle, Rt groin & Lt arm that started about 2 weeks ago. A/Ox4, denies any recent falls but does reports months ago falling and breaking a toe on her Rt ft. Also reports she has been very stressed lately & her PCP sent her here to get her "heart checked" d/t her heart rate being low, she is 58 bpm & intermittently going into the 40's while in triage.

## 2023-01-29 NOTE — ED Provider Notes (Signed)
[place] EMERGENCY DEPARTMENT AT [facility] Provider Note   CSN: [id number] Arrival date & time: 01/29/23  1053     History  Chief Complaint  Patient presents with   Knee Pain   Ankle Pain   Groin Pain   Chest Pain   Bradycardia    BLIMY TUDOR is a 82 y.o. female.  This is a 82 year old female presents emergency department from her PCPs office due to concern for low heart rate.  Patient says that she went to her primary care office today because she had been having pain in both of her hips over the last several months, but today it was a bit worse than usual.  She is also been having pain in her right wrist, left wrist and both elbows.  Patient tells me that she has had a history of a low heart rate for many years.  She was taken off of medications which could lower her heart rate.  She denies any weakness, fatigue, lightheadedness or chest pain.   Knee Pain Ankle Pain Groin Pain Associated symptoms include chest pain.  Chest Pain      Home Medications Prior to Admission medications   Not on File      Allergies    Penicillins and Prednisone    Review of Systems   Review of Systems  Cardiovascular:  Positive for chest pain.    Physical Exam Updated Vital Signs BP (!) 146/74   Pulse 68   Temp 98.7 F (37.1 C) (Oral)   Resp 19   SpO2 100%  Physical Exam Vitals reviewed.  Constitutional:      Appearance: She is well-developed.  Cardiovascular:     Rate and Rhythm: Bradycardia present.     Heart sounds: Normal heart sounds.  Pulmonary:     Effort: No tachypnea or respiratory distress.     Breath sounds: Normal breath sounds.  Musculoskeletal:     Comments: Pelvis stable, nontender  Neurological:     General: No focal deficit present.     Mental Status: She is alert.     Comments: Normal gait     ED Results / Procedures / Treatments   Labs (all labs ordered are listed, but only abnormal results are displayed) Labs Reviewed   BASIC METABOLIC PANEL - Abnormal; Notable for the following components:      Result Value   BUN <5 (*)    All other components within normal limits  CBC - Abnormal; Notable for the following components:   Platelets 404 (*)    All other components within normal limits  URINALYSIS, DIPSTICK ONLY - Abnormal; Notable for the following components:   Color, Urine STRAW (*)    Specific Gravity, Urine 1.003 (*)    Ketones, ur 5 (*)    Leukocytes,Ua SMALL (*)    Bacteria, UA RARE (*)    All other components within normal limits  TROPONIN I (HIGH SENSITIVITY)  TROPONIN I (HIGH SENSITIVITY)    EKG None  Radiology DG Pelvis 1-2 Views  Result Date: 01/29/2023 CLINICAL DATA:  Pelvic and bilateral hip pain without trauma EXAM: PELVIS - 1-2 VIEW COMPARISON:  None Available. FINDINGS: AP view of the pelvis demonstrates no fracture or dislocation. Mild bilateral hip osteoarthritis, greater right than left. Degenerative changes of the left sacroiliac joint. IMPRESSION: Degenerative change, without acute osseous finding. Electronically Signed   By: Jeronimo Greaves M.D.   On: 01/29/2023 12:24   DG Chest 2 View  Result Date:  01/29/2023 CLINICAL DATA:  cp EXAM: CHEST - 2 VIEW COMPARISON:  CXR 04/07/21 FINDINGS: No pleural effusion. No pneumothorax. No focal airspace opacity. Cardiac and mediastinal contours. No radiographically apparent displaced rib fractures. There is a hazy opacity at the left lung base, favored to represent atelectasis. Visualized upper abdomen is unremarkable. Vertebral body heights are maintained. IMPRESSION: Left basilar atelectasis Electronically Signed   By: Lorenza Cambridge M.D.   On: 01/29/2023 12:24    Procedures Procedures    Medications Ordered in ED Medications - No data to display  ED Course/ Medical Decision Making/ A&P                                 Medical Decision Making This is a 82 year old female here today for bilateral hip pain and bradycardia.  Differential  diagnoses include arthritis, cystitis, asymptomatic bradycardia.  Plan-looking at the patient's prior EKGs, she is always bradycardic.  She is not currently on any AV nodal blocking medications.  This appears to be her baseline.  Will obtain a plain film of the patient's pelvis.  Believe this is likely arthritis, no trauma.  Urinalysis ordered.  Reassessment-normal CBC and BMP.  Electrolytes normal.  Patient's plain films normal.  Urinalysis does not show evidence of infection.  Patient ambulatory without any difficulty, feels fine.  Will discharge patient, have her follow-up with her PCP.  Amount and/or Complexity of Data Reviewed Labs: ordered. Radiology: ordered.           Final Clinical Impression(s) / ED Diagnoses Final diagnoses:  Bradycardia    Rx / DC Orders ED Discharge Orders     None         Arletha Pili, DO 01/29/23 1503
